# Patient Record
Sex: Female | Born: 2000 | Hispanic: Yes | Marital: Single | Smoking: Never smoker
Health system: Southern US, Community
[De-identification: ages and names within clinical notes are randomized; demographics above are authoritative.]

## PROBLEM LIST (undated history)

## (undated) DIAGNOSIS — Z789 Other specified health status: Secondary | ICD-10-CM

## (undated) HISTORY — PX: KNEE SURGERY: SHX244

## (undated) HISTORY — PX: TONSILLECTOMY: SUR1361

---
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
## 2021-03-03 DIAGNOSIS — N760 Acute vaginitis: Secondary | ICD-10-CM | POA: Diagnosis not present

## 2021-03-03 DIAGNOSIS — Z113 Encounter for screening for infections with a predominantly sexual mode of transmission: Secondary | ICD-10-CM | POA: Diagnosis not present

## 2021-05-06 DIAGNOSIS — Z113 Encounter for screening for infections with a predominantly sexual mode of transmission: Secondary | ICD-10-CM | POA: Diagnosis not present

## 2021-05-06 DIAGNOSIS — B3731 Acute candidiasis of vulva and vagina: Secondary | ICD-10-CM | POA: Diagnosis not present

## 2021-05-06 DIAGNOSIS — R350 Frequency of micturition: Secondary | ICD-10-CM | POA: Diagnosis not present

## 2021-06-03 ENCOUNTER — Other Ambulatory Visit: Payer: Self-pay

## 2021-06-03 DIAGNOSIS — D72829 Elevated white blood cell count, unspecified: Secondary | ICD-10-CM | POA: Diagnosis not present

## 2021-06-03 DIAGNOSIS — N390 Urinary tract infection, site not specified: Secondary | ICD-10-CM | POA: Insufficient documentation

## 2021-06-03 DIAGNOSIS — R945 Abnormal results of liver function studies: Secondary | ICD-10-CM | POA: Diagnosis not present

## 2021-06-03 DIAGNOSIS — Z20822 Contact with and (suspected) exposure to covid-19: Secondary | ICD-10-CM | POA: Diagnosis not present

## 2021-06-03 DIAGNOSIS — R7401 Elevation of levels of liver transaminase levels: Secondary | ICD-10-CM | POA: Insufficient documentation

## 2021-06-03 DIAGNOSIS — R1111 Vomiting without nausea: Secondary | ICD-10-CM | POA: Diagnosis not present

## 2021-06-03 DIAGNOSIS — R6883 Chills (without fever): Secondary | ICD-10-CM | POA: Diagnosis not present

## 2021-06-03 DIAGNOSIS — R109 Unspecified abdominal pain: Secondary | ICD-10-CM | POA: Diagnosis not present

## 2021-06-04 ENCOUNTER — Emergency Department (HOSPITAL_BASED_OUTPATIENT_CLINIC_OR_DEPARTMENT_OTHER): Payer: Federal, State, Local not specified - PPO

## 2021-06-04 ENCOUNTER — Emergency Department (HOSPITAL_BASED_OUTPATIENT_CLINIC_OR_DEPARTMENT_OTHER)
Admission: EM | Admit: 2021-06-04 | Discharge: 2021-06-04 | Disposition: A | Discharge status: Home / Self Care | Payer: Federal, State, Local not specified - PPO | Attending: Emergency Medicine | Admitting: Emergency Medicine

## 2021-06-04 ENCOUNTER — Encounter (HOSPITAL_BASED_OUTPATIENT_CLINIC_OR_DEPARTMENT_OTHER): Payer: Self-pay | Admitting: Emergency Medicine

## 2021-06-04 DIAGNOSIS — R6883 Chills (without fever): Secondary | ICD-10-CM | POA: Diagnosis not present

## 2021-06-04 DIAGNOSIS — N390 Urinary tract infection, site not specified: Secondary | ICD-10-CM

## 2021-06-04 DIAGNOSIS — R1111 Vomiting without nausea: Secondary | ICD-10-CM | POA: Diagnosis not present

## 2021-06-04 DIAGNOSIS — R109 Unspecified abdominal pain: Secondary | ICD-10-CM | POA: Diagnosis not present

## 2021-06-04 DIAGNOSIS — R7989 Other specified abnormal findings of blood chemistry: Secondary | ICD-10-CM

## 2021-06-04 LAB — URINALYSIS, MICROSCOPIC (REFLEX)

## 2021-06-04 LAB — COMPREHENSIVE METABOLIC PANEL
ALT: 95 U/L — ABNORMAL HIGH (ref 0–44)
AST: 65 U/L — ABNORMAL HIGH (ref 15–41)
Albumin: 3.6 g/dL (ref 3.5–5.0)
Alkaline Phosphatase: 100 U/L (ref 38–126)
Anion gap: 10 (ref 5–15)
BUN: 11 mg/dL (ref 6–20)
CO2: 22 mmol/L (ref 22–32)
Calcium: 8.7 mg/dL — ABNORMAL LOW (ref 8.9–10.3)
Chloride: 103 mmol/L (ref 98–111)
Creatinine, Ser: 0.66 mg/dL (ref 0.44–1.00)
GFR, Estimated: >60 mL/min (ref >60)
Glucose, Bld: 115 mg/dL — ABNORMAL HIGH (ref 70–99)
Potassium: 3.4 mmol/L — ABNORMAL LOW (ref 3.5–5.1)
Sodium: 135 mmol/L (ref 135–145)
Total Bilirubin: 0.8 mg/dL (ref 0.3–1.2)
Total Protein: 6.8 g/dL (ref 6.5–8.1)

## 2021-06-04 LAB — CBC WITH DIFFERENTIAL/PLATELET
Abs Immature Granulocytes: 0.24 10*3/uL — ABNORMAL HIGH (ref 0.00–0.07)
Basophils Absolute: 0.1 10*3/uL (ref 0.0–0.1)
Basophils Relative: 0 %
Eosinophils Absolute: 0.1 10*3/uL (ref 0.0–0.5)
Eosinophils Relative: 1 %
HCT: 37.8 % (ref 36.0–46.0)
Hemoglobin: 12.4 g/dL (ref 12.0–15.0)
Immature Granulocytes: 1 %
Lymphocytes Relative: 6 %
Lymphs Abs: 1.3 10*3/uL (ref 0.7–4.0)
MCH: 30 pg (ref 26.0–34.0)
MCHC: 32.8 g/dL (ref 30.0–36.0)
MCV: 91.3 fL (ref 80.0–100.0)
Monocytes Absolute: 1.3 10*3/uL — ABNORMAL HIGH (ref 0.1–1.0)
Monocytes Relative: 6 %
Neutro Abs: 17.4 10*3/uL — ABNORMAL HIGH (ref 1.7–7.7)
Neutrophils Relative %: 86 %
Platelets: 170 10*3/uL (ref 150–400)
RBC: 4.14 MIL/uL (ref 3.87–5.11)
RDW: 13.2 % (ref 11.5–15.5)
WBC: 20.4 10*3/uL — ABNORMAL HIGH (ref 4.0–10.5)
nRBC: 0 % (ref 0.0–0.2)

## 2021-06-04 LAB — URINALYSIS, ROUTINE W REFLEX MICROSCOPIC
Bilirubin Urine: NEGATIVE
Glucose, UA: 100 mg/dL — AB
Ketones, ur: NEGATIVE mg/dL
Nitrite: NEGATIVE
Protein, ur: 100 mg/dL — AB
Specific Gravity, Urine: 1.02 (ref 1.005–1.030)
pH: 8.5 — ABNORMAL HIGH (ref 5.0–8.0)

## 2021-06-04 LAB — RESP PANEL BY RT-PCR (FLU A&B, COVID) ARPGX2
Influenza A by PCR: NEGATIVE
Influenza B by PCR: NEGATIVE
SARS Coronavirus 2 by RT PCR: NEGATIVE

## 2021-06-04 LAB — PREGNANCY, URINE: Preg Test, Ur: NEGATIVE

## 2021-06-04 MED ORDER — IBUPROFEN 800 MG PO TABS: 800.0000 mg | ORAL_TABLET | Freq: Three times a day (TID) | ORAL | 0 refills | Status: DC

## 2021-06-04 MED ORDER — SODIUM CHLORIDE 0.9 % IV BOLUS
500.0000 mL | Freq: Once | INTRAVENOUS | Status: AC
  Administered 2021-06-04: 500 mL via INTRAVENOUS

## 2021-06-04 MED ORDER — SODIUM CHLORIDE 0.9 % IV SOLN
1.0000 g | Freq: Once | INTRAVENOUS | Status: AC
  Administered 2021-06-04: 1 g via INTRAVENOUS
  Filled 2021-06-04: qty 10

## 2021-06-04 MED ORDER — ONDANSETRON HCL 4 MG PO TABS: 4.0000 mg | ORAL_TABLET | Freq: Three times a day (TID) | ORAL | 0 refills | Status: DC | PRN

## 2021-06-04 MED ORDER — CEPHALEXIN 500 MG PO CAPS: 500.0000 mg | ORAL_CAPSULE | Freq: Four times a day (QID) | ORAL | 0 refills | Status: DC

## 2021-06-04 NOTE — ED Triage Notes (Signed)
N/v, chills, and right sided abdominal pain since yesterday.  ?

## 2021-06-04 NOTE — ED Notes (Signed)
Patient transported to CT 

## 2021-06-04 NOTE — ED Provider Notes (Signed)
?[facility] EMERGENCY DEPARTMENT ?Provider Note ? ? ?CSN: [id number] ?Arrival date & time: 06/03/21  2358 ? ?  ? ?History ? ?Chief Complaint  ?Patient presents with  ? Nausea  ? Abdominal Pain  ? ? ?Donna Bennett is a 21 y.o. female. ? ?The history is provided by the patient.  ?Abdominal Pain ?Pain location:  R flank ?Pain quality: aching   ?Pain radiates to:  Does not radiate ?Pain severity:  Severe ?Onset quality:  Gradual ?Duration:  4 days ?Timing:  Constant ?Progression:  Unchanged ?Chronicity:  New ?Context: not laxative use, not recent travel and not trauma   ?Relieved by:  Nothing ?Worsened by:  Nothing ?Ineffective treatments:  None tried ?Associated symptoms: chills, nausea and vomiting   ?Associated symptoms: no chest pain, no cough, no dysuria and no fever   ?Risk factors: no NSAID use and not pregnant   ? ?  ? ?Home Medications ?Prior to Admission medications   ?Medication Sig Start Date End Date Taking? Authorizing Provider  ?cephALEXin (KEFLEX) 500 MG capsule Take 1 capsule (500 mg total) by mouth 4 (four) times daily. 06/04/21  Yes Zakir Henner, MD  ?ondansetron (ZOFRAN) 4 MG tablet Take 1 tablet (4 mg total) by mouth every 8 (eight) hours as needed for nausea or vomiting. 06/04/21  Yes Paitynn Mikus, MD  ?clotrimazole (LOTRIMIN) 1 % cream Apply to affected area 2 times daily for 2 weeks or until symptoms resolve 08/14/15   Street, [place], [place]  ?   ? ?Allergies    ?Patient has no known allergies.   ? ?Review of Systems   ?Review of Systems  ?Constitutional:  Positive for chills. Negative for fever.  ?HENT:  Negative for facial swelling.   ?Eyes:  Negative for redness.  ?Respiratory:  Negative for cough.   ?Cardiovascular:  Negative for chest pain.  ?Gastrointestinal:  Positive for abdominal pain, nausea and vomiting.  ?Genitourinary:  Positive for flank pain. Negative for difficulty urinating and dysuria.  ?Neurological:  Negative for facial asymmetry.  ?Psychiatric/Behavioral:  Negative  for agitation.   ?All other systems reviewed and are negative. ? ?Physical Exam ?Updated Vital Signs ?BP (!) 105/55   Pulse (!) 111   Temp 98.8 ?F (37.1 ?C) (Oral)   Resp 18   Ht 5\' 3"  (1.6 m)   Wt 64.9 kg   LMP 05/17/2021 (Exact Date)   SpO2 100%   BMI 25.35 kg/m?  ?Physical Exam ?Vitals and nursing note reviewed.  ?Constitutional:   ?   General: She is not in acute distress. ?   Appearance: Normal appearance.  ?HENT:  ?   Head: Normocephalic and atraumatic.  ?   Nose: Nose normal.  ?Eyes:  ?   Conjunctiva/sclera: Conjunctivae normal.  ?   Pupils: Pupils are equal, round, and reactive to light.  ?Cardiovascular:  ?   Rate and Rhythm: Normal rate and regular rhythm.  ?   Pulses: Normal pulses.  ?   Heart sounds: Normal heart sounds.  ?Pulmonary:  ?   Effort: Pulmonary effort is normal.  ?   Breath sounds: Normal breath sounds.  ?Abdominal:  ?   General: Abdomen is flat. Bowel sounds are normal.  ?   Palpations: Abdomen is soft. There is no mass.  ?   Tenderness: There is no abdominal tenderness. There is no guarding or rebound. Negative signs include Murphy's sign, Rovsing's sign and McBurney's sign.  ?   Hernia: No hernia is present.  ?Musculoskeletal:     ?  General: Normal range of motion.  ?   Cervical back: Normal range of motion and neck supple.  ?Skin: ?   General: Skin is warm and dry.  ?   Capillary Refill: Capillary refill takes less than 2 seconds.  ?Neurological:  ?   Mental Status: She is alert.  ?   Deep Tendon Reflexes: Reflexes normal.  ?Psychiatric:     ?   Mood and Affect: Mood normal.     ?   Behavior: Behavior normal.  ? ? ?ED Results / Procedures / Treatments   ?Labs ?(all labs ordered are listed, but only abnormal results are displayed) ?Results for orders placed or performed during the hospital encounter of 06/04/21  ?Pregnancy, urine  ?Result Value Ref Range  ? Preg Test, Ur NEGATIVE NEGATIVE  ?Urinalysis, Routine w reflex microscopic Urine, Clean Catch  ?Result Value Ref Range  ?  Color, Urine YELLOW YELLOW  ? APPearance CLOUDY (A) CLEAR  ? Specific Gravity, Urine 1.020 1.005 - 1.030  ? pH 8.5 (H) 5.0 - 8.0  ? Glucose, UA 100 (A) NEGATIVE mg/dL  ? Hgb urine dipstick TRACE (A) NEGATIVE  ? Bilirubin Urine NEGATIVE NEGATIVE  ? Ketones, ur NEGATIVE NEGATIVE mg/dL  ? Protein, ur 100 (A) NEGATIVE mg/dL  ? Nitrite NEGATIVE NEGATIVE  ? Leukocytes,Ua SMALL (A) NEGATIVE  ?Comprehensive metabolic panel  ?Result Value Ref Range  ? Sodium 135 135 - 145 mmol/L  ? Potassium 3.4 (L) 3.5 - 5.1 mmol/L  ? Chloride 103 98 - 111 mmol/L  ? CO2 22 22 - 32 mmol/L  ? Glucose, Bld 115 (H) 70 - 99 mg/dL  ? BUN 11 6 - 20 mg/dL  ? Creatinine, Ser 0.66 0.44 - 1.00 mg/dL  ? Calcium 8.7 (L) 8.9 - 10.3 mg/dL  ? Total Protein 6.8 6.5 - 8.1 g/dL  ? Albumin 3.6 3.5 - 5.0 g/dL  ? AST 65 (H) 15 - 41 U/L  ? ALT 95 (H) 0 - 44 U/L  ? Alkaline Phosphatase 100 38 - 126 U/L  ? Total Bilirubin 0.8 0.3 - 1.2 mg/dL  ? GFR, Estimated >60 >60 mL/min  ? Anion gap 10 5 - 15  ?CBC with Differential  ?Result Value Ref Range  ? WBC 20.4 (H) 4.0 - 10.5 K/uL  ? RBC 4.14 3.87 - 5.11 MIL/uL  ? Hemoglobin 12.4 12.0 - 15.0 g/dL  ? HCT 37.8 36.0 - 46.0 %  ? MCV 91.3 80.0 - 100.0 fL  ? MCH 30.0 26.0 - 34.0 pg  ? MCHC 32.8 30.0 - 36.0 g/dL  ? RDW 13.2 11.5 - 15.5 %  ? Platelets 170 150 - 400 K/uL  ? nRBC 0.0 0.0 - 0.2 %  ? Neutrophils Relative % 86 %  ? Neutro Abs 17.4 (H) 1.7 - 7.7 K/uL  ? Lymphocytes Relative 6 %  ? Lymphs Abs 1.3 0.7 - 4.0 K/uL  ? Monocytes Relative 6 %  ? Monocytes Absolute 1.3 (H) 0.1 - 1.0 K/uL  ? Eosinophils Relative 1 %  ? Eosinophils Absolute 0.1 0.0 - 0.5 K/uL  ? Basophils Relative 0 %  ? Basophils Absolute 0.1 0.0 - 0.1 K/uL  ? Immature Granulocytes 1 %  ? Abs Immature Granulocytes 0.24 (H) 0.00 - 0.07 K/uL  ?Urinalysis, Microscopic (reflex)  ?Result Value Ref Range  ? RBC / HPF 0-5 0 - 5 RBC/hpf  ? WBC, UA 21-50 0 - 5 WBC/hpf  ? Bacteria, UA MANY (A) NONE SEEN  ? Squamous Epithelial /  LPF 11-20 0 - 5  ? WBC Clumps PRESENT    ? ?CT Renal Stone Study ? ?Result Date: 06/04/2021 ?CLINICAL DATA:  Nausea, vomiting, chills, right side abdominal pain. EXAM: CT ABDOMEN AND PELVIS WITHOUT CONTRAST TECHNIQUE: Multidetector CT imaging of the abdomen and pelvis was performed following the standard protocol without IV contrast. RADIATION DOSE REDUCTION: This exam was performed according to the departmental dose-optimization program which includes automated exposure control, adjustment of the mA and/or kV according to patient size and/or use of iterative reconstruction technique. COMPARISON:  None. FINDINGS: Lower chest: No acute abnormality Hepatobiliary: No focal hepatic abnormality. Gallbladder unremarkable. Pancreas: No focal abnormality or ductal dilatation. Spleen: No focal abnormality.  Normal size. Adrenals/Urinary Tract: No renal or ureteral stones. No hydronephrosis. No renal or adrenal mass. Urinary bladder unremarkable. Stomach/Bowel: Normal appendix. Stomach, large and small bowel grossly unremarkable. Vascular/Lymphatic: No evidence of aneurysm or adenopathy. Reproductive: Uterus and adnexa unremarkable.  No mass. Other: No free fluid or free air. Musculoskeletal: No acute bony abnormality. IMPRESSION: No renal or ureteral stones.  No hydronephrosis. Electronically Signed   By: Charlett Nose M.D.   On: 06/04/2021 02:04   ? ? ?None ? ?Radiology ?CT Renal Stone Study ? ?Result Date: 06/04/2021 ?CLINICAL DATA:  Nausea, vomiting, chills, right side abdominal pain. EXAM: CT ABDOMEN AND PELVIS WITHOUT CONTRAST TECHNIQUE: Multidetector CT imaging of the abdomen and pelvis was performed following the standard protocol without IV contrast. RADIATION DOSE REDUCTION: This exam was performed according to the departmental dose-optimization program which includes automated exposure control, adjustment of the mA and/or kV according to patient size and/or use of iterative reconstruction technique. COMPARISON:  None. FINDINGS: Lower chest: No acute  abnormality Hepatobiliary: No focal hepatic abnormality. Gallbladder unremarkable. Pancreas: No focal abnormality or ductal dilatation. Spleen: No focal abnormality.  Normal size. Adrenals/Urinary Tract: No renal or ur

## 2021-06-04 NOTE — ED Notes (Signed)
Patient discharged to home.  All discharge instructions reviewed.  Patient verbalized understanding via teachback method.  VS WDL.  Respirations even and unlabored.  Ambulatory out of ED.   °

## 2021-06-11 DIAGNOSIS — Z113 Encounter for screening for infections with a predominantly sexual mode of transmission: Secondary | ICD-10-CM | POA: Diagnosis not present

## 2021-06-11 DIAGNOSIS — Z6825 Body mass index (BMI) 25.0-25.9, adult: Secondary | ICD-10-CM | POA: Diagnosis not present

## 2021-06-11 DIAGNOSIS — Z01419 Encounter for gynecological examination (general) (routine) without abnormal findings: Secondary | ICD-10-CM | POA: Diagnosis not present

## 2021-06-11 DIAGNOSIS — R8279 Other abnormal findings on microbiological examination of urine: Secondary | ICD-10-CM | POA: Diagnosis not present

## 2021-06-12 DIAGNOSIS — L400 Psoriasis vulgaris: Secondary | ICD-10-CM | POA: Diagnosis not present

## 2021-09-17 DIAGNOSIS — Z113 Encounter for screening for infections with a predominantly sexual mode of transmission: Secondary | ICD-10-CM | POA: Diagnosis not present

## 2021-09-17 DIAGNOSIS — N760 Acute vaginitis: Secondary | ICD-10-CM | POA: Diagnosis not present

## 2022-02-18 DIAGNOSIS — N911 Secondary amenorrhea: Secondary | ICD-10-CM | POA: Diagnosis not present

## 2022-03-03 DIAGNOSIS — Z3400 Encounter for supervision of normal first pregnancy, unspecified trimester: Secondary | ICD-10-CM | POA: Diagnosis not present

## 2022-03-03 DIAGNOSIS — Z3685 Encounter for antenatal screening for Streptococcus B: Secondary | ICD-10-CM | POA: Diagnosis not present

## 2022-03-03 DIAGNOSIS — Z3A08 8 weeks gestation of pregnancy: Secondary | ICD-10-CM | POA: Diagnosis not present

## 2022-03-03 DIAGNOSIS — Z3481 Encounter for supervision of other normal pregnancy, first trimester: Secondary | ICD-10-CM | POA: Diagnosis not present

## 2022-03-03 LAB — OB RESULTS CONSOLE HEPATITIS B SURFACE ANTIGEN: Hepatitis B Surface Ag: NEGATIVE

## 2022-03-03 LAB — OB RESULTS CONSOLE ABO/RH: RH Type: POSITIVE

## 2022-03-03 LAB — OB RESULTS CONSOLE ANTIBODY SCREEN: Antibody Screen: NEGATIVE

## 2022-03-03 LAB — HEPATITIS C ANTIBODY: HCV Ab: NEGATIVE

## 2022-03-19 DIAGNOSIS — Z113 Encounter for screening for infections with a predominantly sexual mode of transmission: Secondary | ICD-10-CM | POA: Diagnosis not present

## 2022-03-19 DIAGNOSIS — Z3400 Encounter for supervision of normal first pregnancy, unspecified trimester: Secondary | ICD-10-CM | POA: Diagnosis not present

## 2022-03-19 DIAGNOSIS — Z3A10 10 weeks gestation of pregnancy: Secondary | ICD-10-CM | POA: Diagnosis not present

## 2022-03-19 LAB — OB RESULTS CONSOLE RUBELLA ANTIBODY, IGM: Rubella: IMMUNE

## 2022-03-19 LAB — OB RESULTS CONSOLE GC/CHLAMYDIA
Chlamydia: NEGATIVE
Neisseria Gonorrhea: NEGATIVE

## 2022-04-11 ENCOUNTER — Other Ambulatory Visit: Payer: Self-pay

## 2022-04-11 ENCOUNTER — Encounter (HOSPITAL_BASED_OUTPATIENT_CLINIC_OR_DEPARTMENT_OTHER): Payer: Self-pay

## 2022-04-11 ENCOUNTER — Emergency Department (HOSPITAL_BASED_OUTPATIENT_CLINIC_OR_DEPARTMENT_OTHER)
Admission: EM | Admit: 2022-04-11 | Discharge: 2022-04-11 | Disposition: A | Discharge status: Home / Self Care | Payer: No Typology Code available for payment source | Attending: Emergency Medicine | Admitting: Emergency Medicine

## 2022-04-11 DIAGNOSIS — S301XXA Contusion of abdominal wall, initial encounter: Secondary | ICD-10-CM | POA: Insufficient documentation

## 2022-04-11 DIAGNOSIS — W01198A Fall on same level from slipping, tripping and stumbling with subsequent striking against other object, initial encounter: Secondary | ICD-10-CM | POA: Diagnosis not present

## 2022-04-11 DIAGNOSIS — O9A213 Injury, poisoning and certain other consequences of external causes complicating pregnancy, third trimester: Secondary | ICD-10-CM | POA: Diagnosis present

## 2022-04-11 DIAGNOSIS — W19XXXA Unspecified fall, initial encounter: Secondary | ICD-10-CM

## 2022-04-11 NOTE — ED Provider Notes (Signed)
[facility] Provider Note: Georgena Spurling, MD, FACEP  CSN: [id number] MRN: [id number] ARRIVAL: 04/11/22 at [place]: [place]  Fall   HISTORY OF PRESENT ILLNESS  04/11/22 3:18 AM Donna Bennett is a 22 y.o. female who is about [redacted] weeks pregnant.  She slipped and fell approximately 2 hours prior to arrival and struck the left upper quadrant of her abdomen on the corner of an object.  There is a small localized contusion at that site, and is having pain at that site which she rates as a 3 out of 10.  She is having no vaginal bleeding.Marland Kitchen    History reviewed. No pertinent past medical history.  Past Surgical History:  Procedure Laterality Date   KNEE SURGERY     TONSILLECTOMY      History reviewed. No pertinent family history.  Social History   Tobacco Use   Smoking status: Never   Smokeless tobacco: Never  Substance Use Topics   Alcohol use: No   Drug use: Never    Prior to Admission medications   Not on File    Allergies Patient has no known allergies.   REVIEW OF SYSTEMS  Negative except as noted here or in the History of Present Illness.   PHYSICAL EXAMINATION  Initial Vital Signs Blood pressure 132/80, pulse (!) 107, temperature 98 F (36.7 C), temperature source Oral, resp. rate 16, height '5\' 3"'$  (1.6 m), weight 63.5 kg, last menstrual period 12/24/2021, SpO2 100 %.  Examination General: Well-developed, well-nourished female in no acute distress; appearance consistent with age of record HENT: normocephalic; atraumatic Eyes: Normal appearance Neck: supple Heart: regular rate and rhythm Lungs: clear to auscultation bilaterally Abdomen: soft; nondistended; small contusion of the left upper quadrant with localized tenderness, no suprapubic tenderness; bowel sounds present GU: Cervix closed, thick and high; no vaginal bleeding; single IUP seen on bedside ultrasound, FHR 144 Extremities: No deformity; full range of motion;  pulses normal Neurologic: Awake, alert and oriented; motor function intact in all extremities and symmetric; no facial droop Skin: Warm and dry Psychiatric: Normal mood and affect   RESULTS  Summary of this visit's results, reviewed and interpreted by myself:   EKG Interpretation  Date/Time:    Ventricular Rate:    PR Interval:    QRS Duration:   QT Interval:    QTC Calculation:   R Axis:     Text Interpretation:         Laboratory Studies: No results found for this or any previous visit (from the past 24 hour(s)). Imaging Studies: No results found.  ED COURSE and MDM  Nursing notes, initial and subsequent vitals signs, including pulse oximetry, reviewed and interpreted by myself.  Vitals:   04/11/22 0315  BP: 132/80  Pulse: (!) 107  Resp: 16  Temp: 98 F (36.7 C)  TempSrc: Oral  SpO2: 100%  Weight: 63.5 kg  Height: '5\' 3"'$  (1.6 m)   Medications - No data to display  The location of the patient's injury is located significantly higher than her uterine fundus.  I think the chances of any uterine damage is minimal.  Fetal heart tones and fetal movement seen on bedside ultrasound are reassuring.  The tenderness and contusion of her abdomen is very localized.  She was advised that should she have any worsening symptoms she should go to MAU at [facility].  PROCEDURES  Procedures   ED DIAGNOSES     ICD-10-CM   1. Fall,  initial encounter  W19.XXXA     2. Contusion of anterior abdominal wall  S30.1XXA          Stanislav Gervase, Jenny Reichmann, MD 04/11/22 [phone]

## 2022-04-11 NOTE — ED Triage Notes (Signed)
Pt states that she slipped and fell approx 2 hours ago at work and landed on left side of abdomen. Pt is [redacted] weeks pregnant.

## 2022-04-11 NOTE — ED Notes (Signed)
Dr Florina Ou at bedside with [place] machine

## 2022-05-28 DIAGNOSIS — Z363 Encounter for antenatal screening for malformations: Secondary | ICD-10-CM | POA: Diagnosis not present

## 2022-05-28 DIAGNOSIS — Z361 Encounter for antenatal screening for raised alphafetoprotein level: Secondary | ICD-10-CM | POA: Diagnosis not present

## 2022-05-28 DIAGNOSIS — Z3A20 20 weeks gestation of pregnancy: Secondary | ICD-10-CM | POA: Diagnosis not present

## 2022-07-07 DIAGNOSIS — Z3A26 26 weeks gestation of pregnancy: Secondary | ICD-10-CM | POA: Diagnosis not present

## 2022-07-07 DIAGNOSIS — O368120 Decreased fetal movements, second trimester, not applicable or unspecified: Secondary | ICD-10-CM | POA: Diagnosis not present

## 2022-07-22 DIAGNOSIS — Z3480 Encounter for supervision of other normal pregnancy, unspecified trimester: Secondary | ICD-10-CM | POA: Diagnosis not present

## 2022-07-22 DIAGNOSIS — Z23 Encounter for immunization: Secondary | ICD-10-CM | POA: Diagnosis not present

## 2022-07-22 LAB — OB RESULTS CONSOLE RPR: RPR: NONREACTIVE

## 2022-07-22 LAB — OB RESULTS CONSOLE HIV ANTIBODY (ROUTINE TESTING): HIV: NONREACTIVE

## 2022-08-07 DIAGNOSIS — M5489 Other dorsalgia: Secondary | ICD-10-CM | POA: Diagnosis not present

## 2022-09-03 ENCOUNTER — Inpatient Hospital Stay (HOSPITAL_COMMUNITY)
Admission: AD | Admit: 2022-09-03 | Discharge: 2022-09-03 | Disposition: A | Discharge status: Home / Self Care | Payer: Federal, State, Local not specified - PPO | Attending: Obstetrics and Gynecology | Admitting: Obstetrics and Gynecology

## 2022-09-03 ENCOUNTER — Encounter (HOSPITAL_COMMUNITY): Payer: Self-pay | Admitting: *Deleted

## 2022-09-03 DIAGNOSIS — B3731 Acute candidiasis of vulva and vagina: Secondary | ICD-10-CM | POA: Insufficient documentation

## 2022-09-03 DIAGNOSIS — O23593 Infection of other part of genital tract in pregnancy, third trimester: Secondary | ICD-10-CM | POA: Insufficient documentation

## 2022-09-03 DIAGNOSIS — Z3A34 34 weeks gestation of pregnancy: Secondary | ICD-10-CM

## 2022-09-03 DIAGNOSIS — B9689 Other specified bacterial agents as the cause of diseases classified elsewhere: Secondary | ICD-10-CM | POA: Insufficient documentation

## 2022-09-03 DIAGNOSIS — O98813 Other maternal infectious and parasitic diseases complicating pregnancy, third trimester: Secondary | ICD-10-CM | POA: Diagnosis not present

## 2022-09-03 DIAGNOSIS — Z0371 Encounter for suspected problem with amniotic cavity and membrane ruled out: Secondary | ICD-10-CM | POA: Diagnosis not present

## 2022-09-03 DIAGNOSIS — O26893 Other specified pregnancy related conditions, third trimester: Secondary | ICD-10-CM | POA: Diagnosis not present

## 2022-09-03 HISTORY — DX: Other specified health status: Z78.9

## 2022-09-03 LAB — WET PREP, GENITAL
Sperm: NONE SEEN
Trich, Wet Prep: NONE SEEN
WBC, Wet Prep HPF POC: >=10 — AB (ref <10)

## 2022-09-03 LAB — URINALYSIS, ROUTINE W REFLEX MICROSCOPIC
Bilirubin Urine: NEGATIVE
Glucose, UA: 50 mg/dL — AB
Hgb urine dipstick: NEGATIVE
Ketones, ur: NEGATIVE mg/dL
Nitrite: NEGATIVE
Protein, ur: NEGATIVE mg/dL
Specific Gravity, Urine: 1.009 (ref 1.005–1.030)
pH: 7 (ref 5.0–8.0)

## 2022-09-03 LAB — RUPTURE OF MEMBRANE (ROM)PLUS: Rom Plus: NEGATIVE

## 2022-09-03 MED ORDER — TERCONAZOLE 0.8 % VA CREA: 1.0000 | TOPICAL_CREAM | Freq: Every day | VAGINAL | 0 refills | Status: DC

## 2022-09-03 MED ORDER — METRONIDAZOLE 500 MG PO TABS: 500.0000 mg | ORAL_TABLET | Freq: Two times a day (BID) | ORAL | 0 refills | Status: AC

## 2022-09-03 NOTE — MAU Note (Signed)
.  Donna Bennett is a 22 y.o. at [redacted]w[redacted]d here in MAU reporting since 1300 has had several occ of leaking white watery liquid. Denies VB. Some mild cramping. Reports good FM Onset of complaint: 1300 Pain score: 8 Vitals:   09/03/22 1946 09/03/22 1949  BP:  126/76  Pulse: 94   Resp: 18   Temp: 97.9 F (36.6 C)   SpO2: 100%      FHT:160 Lab orders placed from triage:  u/a

## 2022-09-03 NOTE — MAU Provider Note (Signed)
Obstetric Attending MAU Note  Chief Complaint:  Vaginal Discharge and Abdominal Pain   Event Date/Time   First Provider Initiated Contact with Patient 09/03/22 2020     HPI: Donna Bennett is a 22 y.o. G2P0010 at [redacted]w[redacted]d who presents to maternity admissions reporting leaking of clear/white discharge today. It kept running out. Also with mild cramping. Denies vaginal bleeding. Good fetal movement.   Pregnancy Course: Receives care at [facility]  Prenatal records reviewed - no issues.  Past Medical History:  Diagnosis Date   Medical history non-contributory     OB History  Gravida Para Term Preterm AB Living  2       1    SAB IAB Ectopic Multiple Live Births               # Outcome Date GA Lbr Len/2nd Weight Sex Type Anes PTL Lv  2 Current           1 AB             Past Surgical History:  Procedure Laterality Date   KNEE SURGERY     TONSILLECTOMY      Family History: History reviewed. No pertinent family history.  Social History: Social History   Tobacco Use   Smoking status: Never   Smokeless tobacco: Never  Substance Use Topics   Alcohol use: No   Drug use: Never    Allergies: No Known Allergies  Medications Prior to Admission  Medication Sig Dispense Refill Last Dose   Prenatal Vit-Fe Fumarate-FA (MULTIVITAMIN-PRENATAL) 27-0.8 MG TABS tablet Take 1 tablet by mouth daily at 12 noon.   09/03/2022    ROS: Pertinent findings in history of present illness.  Physical Exam  Blood pressure 126/76, pulse 94, temperature 97.9 F (36.6 C), resp. rate 18, height 5\' 3"  (1.6 m), weight 75.3 kg, last menstrual period 12/24/2021, SpO2 100%. CONSTITUTIONAL: Well-developed, well-nourished female in no acute distress.  HENT:  Normocephalic, atraumatic, External right and left ear normal. Oropharynx is clear and moist EYES: Conjunctivae and EOM are normal. No scleral icterus.  NECK: Normal range of motion, supple, no masses SKIN: Skin is warm and dry. No rash noted. Not  diaphoretic. No erythema. No pallor. NEUROLGIC: Alert and oriented to person, place, and time. CARDIOVASCULAR: Normal heart rate noted, regular rhythm RESPIRATORY: Effort and breath sounds normal, no problems with respiration noted ABDOMEN: Soft, nontender, nondistended, gravid appropriate for gestational age MUSCULOSKELETAL: Normal range of motion. No edema and no tenderness. 2+ distal pulses.  SPECULUM EXAM: NEFG, adherent yellow discharge, no blood, cervix clean Dilation: 1 Exam by:: Shawnie Pons, MD    FHT:  Baseline 140 , moderate variability, accelerations present, no decelerations Contractions: q 6-10 mins   Labs: Results for orders placed or performed during the hospital encounter of 09/03/22 (from the past 24 hour(s))  Urinalysis, Routine w reflex microscopic -Urine, Clean Catch     Status: Abnormal   Collection Time: 09/03/22  8:28 PM  Result Value Ref Range   Color, Urine YELLOW YELLOW   APPearance CLEAR CLEAR   Specific Gravity, Urine 1.009 1.005 - 1.030   pH 7.0 5.0 - 8.0   Glucose, UA 50 (A) NEGATIVE mg/dL   Hgb urine dipstick NEGATIVE NEGATIVE   Bilirubin Urine NEGATIVE NEGATIVE   Ketones, ur NEGATIVE NEGATIVE mg/dL   Protein, ur NEGATIVE NEGATIVE mg/dL   Nitrite NEGATIVE NEGATIVE   Leukocytes,Ua MODERATE (A) NEGATIVE   RBC / HPF 0-5 0 - 5 RBC/hpf   WBC, UA 0-5  0 - 5 WBC/hpf   Bacteria, UA RARE (A) NONE SEEN   Squamous Epithelial / HPF 6-10 0 - 5 /HPF  Wet prep, genital     Status: Abnormal   Collection Time: 09/03/22  8:28 PM  Result Value Ref Range   Yeast Wet Prep HPF POC PRESENT (A) NONE SEEN   Trich, Wet Prep NONE SEEN NONE SEEN   Clue Cells Wet Prep HPF POC PRESENT (A) NONE SEEN   WBC, Wet Prep HPF POC >=10 (A) <10   Sperm NONE SEEN   Rupture of Membrane (ROM) Plus     Status: None   Collection Time: 09/03/22  8:28 PM  Result Value Ref Range   Rom Plus NEGATIVE     Imaging:  No results found.  MAU Course: Monitored x 1 hour ROM plus  negative  Assessment: 1. Encounter for suspected premature rupture of amniotic membranes, with rupture of membranes not found   2. [redacted] weeks gestation of pregnancy   3. Bacterial vaginosis in pregnancy   4. Candida vaginitis     Plan: Discharge home Rx for BV and yeast sent over Labor precautions and fetal kick counts reviewed Follow up with OB provider   Follow-up Information     [facility] Follow up in 1 week(s).   Why: keep next scheduled appointment Contact information: [address] [place] [address] [phone]                 Allergies as of 09/03/2022   No Known Allergies      Medication List     TAKE these medications    metroNIDAZOLE 500 MG tablet Commonly known as: FLAGYL Take 1 tablet (500 mg total) by mouth 2 (two) times daily for 7 days.   multivitamin-prenatal 27-0.8 MG Tabs tablet Take 1 tablet by mouth daily at 12 noon.   terconazole 0.8 % vaginal cream Commonly known as: TERAZOL 3 Place 1 applicator vaginally at bedtime.        Reva Bores, MD 09/03/2022 9:19 PM

## 2022-09-08 LAB — OB RESULTS CONSOLE GBS: GBS: POSITIVE

## 2022-09-24 ENCOUNTER — Inpatient Hospital Stay (HOSPITAL_COMMUNITY)
Admission: AD | Admit: 2022-09-24 | Discharge: 2022-09-24 | Disposition: A | Discharge status: Home / Self Care | Payer: Federal, State, Local not specified - PPO | Attending: Obstetrics and Gynecology | Admitting: Obstetrics and Gynecology

## 2022-09-24 ENCOUNTER — Other Ambulatory Visit: Payer: Self-pay

## 2022-09-24 ENCOUNTER — Encounter (HOSPITAL_COMMUNITY): Payer: Self-pay | Admitting: Obstetrics and Gynecology

## 2022-09-24 DIAGNOSIS — O163 Unspecified maternal hypertension, third trimester: Secondary | ICD-10-CM | POA: Diagnosis not present

## 2022-09-24 DIAGNOSIS — O26893 Other specified pregnancy related conditions, third trimester: Secondary | ICD-10-CM | POA: Diagnosis not present

## 2022-09-24 DIAGNOSIS — R030 Elevated blood-pressure reading, without diagnosis of hypertension: Secondary | ICD-10-CM | POA: Diagnosis not present

## 2022-09-24 DIAGNOSIS — R1010 Upper abdominal pain, unspecified: Secondary | ICD-10-CM | POA: Diagnosis not present

## 2022-09-24 DIAGNOSIS — Z3A37 37 weeks gestation of pregnancy: Secondary | ICD-10-CM | POA: Diagnosis not present

## 2022-09-24 DIAGNOSIS — O1203 Gestational edema, third trimester: Secondary | ICD-10-CM | POA: Insufficient documentation

## 2022-09-24 DIAGNOSIS — M7989 Other specified soft tissue disorders: Secondary | ICD-10-CM | POA: Diagnosis not present

## 2022-09-24 LAB — COMPREHENSIVE METABOLIC PANEL
ALT: 12 U/L (ref 0–44)
AST: 17 U/L (ref 15–41)
Albumin: 2.9 g/dL — ABNORMAL LOW (ref 3.5–5.0)
Alkaline Phosphatase: 187 U/L — ABNORMAL HIGH (ref 38–126)
Anion gap: 10 (ref 5–15)
BUN: 5 mg/dL — ABNORMAL LOW (ref 6–20)
CO2: 20 mmol/L — ABNORMAL LOW (ref 22–32)
Calcium: 8.8 mg/dL — ABNORMAL LOW (ref 8.9–10.3)
Chloride: 105 mmol/L (ref 98–111)
Creatinine, Ser: 0.46 mg/dL (ref 0.44–1.00)
GFR, Estimated: >60 mL/min (ref >60)
Glucose, Bld: 99 mg/dL (ref 70–99)
Potassium: 3.7 mmol/L (ref 3.5–5.1)
Sodium: 135 mmol/L (ref 135–145)
Total Bilirubin: 0.3 mg/dL (ref 0.3–1.2)
Total Protein: 6.4 g/dL — ABNORMAL LOW (ref 6.5–8.1)

## 2022-09-24 LAB — CBC
HCT: 35.1 % — ABNORMAL LOW (ref 36.0–46.0)
Hemoglobin: 11.1 g/dL — ABNORMAL LOW (ref 12.0–15.0)
MCH: 26.1 pg (ref 26.0–34.0)
MCHC: 31.6 g/dL (ref 30.0–36.0)
MCV: 82.6 fL (ref 80.0–100.0)
Platelets: 276 10*3/uL (ref 150–400)
RBC: 4.25 MIL/uL (ref 3.87–5.11)
RDW: 13.8 % (ref 11.5–15.5)
WBC: 8.7 10*3/uL (ref 4.0–10.5)
nRBC: 0 % (ref 0.0–0.2)

## 2022-09-24 LAB — PROTEIN / CREATININE RATIO, URINE
Creatinine, Urine: 22 mg/dL
Total Protein, Urine: <6 mg/dL

## 2022-09-24 NOTE — MAU Note (Signed)
.  Donna Bennett is a 22 y.o. at [redacted]w[redacted]d here in MAU reporting: Pt reports she was being seen at the doctor today and was sent over for further evaluation of her blood pressure.  Pt reports feeling dizzy and blurry vision that started a couple of weeks ago.   Onset of complaint: a couple of weeks ago  Pain score: denies  There were no vitals filed for this visit.    Lab orders placed from triage:   ua

## 2022-09-24 NOTE — MAU Provider Note (Signed)
History     CSN: [id number]  Arrival date and time: 09/24/22 1452   None     Chief Complaint  Patient presents with   Hypertension   HPI Donna Bennett is a 22 yo G2P0010 @ [redacted]w[redacted]d presenting for elevated blood pressure.  Was seen in the clinic today and sent for evaluation for preeclampsia.  Patient reports this is the first time her blood pressure has been elevated.  She denies any headaches.  Reports blurry vision occasionally but not constant.  Reports upper abdominal pain bilaterally.  Patient states that she does have increased swelling in her feet that gradually increases throughout the day and decreases at night.  Denies any vaginal bleeding, gush of fluid, contractions.  Good fetal movement.  No other concerns. OB History     Gravida  2   Para      Term      Preterm      AB  1   Living         SAB      IAB      Ectopic      Multiple      Live Births              Past Medical History:  Diagnosis Date   Medical history non-contributory     Past Surgical History:  Procedure Laterality Date   KNEE SURGERY     TONSILLECTOMY      History reviewed. No pertinent family history.  Social History   Tobacco Use   Smoking status: Never   Smokeless tobacco: Never  Substance Use Topics   Alcohol use: No   Drug use: Never    Allergies: No Known Allergies  Medications Prior to Admission  Medication Sig Dispense Refill Last Dose   Prenatal Vit-Fe Fumarate-FA (MULTIVITAMIN-PRENATAL) 27-0.8 MG TABS tablet Take 1 tablet by mouth daily at 12 noon.      terconazole (TERAZOL 3) 0.8 % vaginal cream Place 1 applicator vaginally at bedtime. 20 g 0     Review of Systems  Constitutional:  Negative for fever.  HENT:  Negative for congestion and rhinorrhea.   Eyes:  Positive for visual disturbance (Occasional blurriness).  Respiratory:  Negative for shortness of breath.   Cardiovascular:  Negative for chest pain.  Gastrointestinal:  Positive for abdominal  pain (Bilaterally in the upper aspects of her abdomen). Negative for diarrhea, nausea and rectal pain.  All other systems reviewed and are negative.  Physical Exam   Blood pressure 119/74, pulse (!) 107, temperature 97.7 F (36.5 C), temperature source Oral, resp. rate (!) 22, weight 77.7 kg, last menstrual period 12/24/2021, SpO2 100%.  Physical Exam Vitals and nursing note reviewed.  Constitutional:      Appearance: Normal appearance.  HENT:     Head: Normocephalic and atraumatic.     Nose: Nose normal.     Mouth/Throat:     Mouth: Mucous membranes are moist.  Eyes:     Pupils: Pupils are equal, round, and reactive to light.  Cardiovascular:     Rate and Rhythm: Normal rate.     Pulses: Normal pulses.  Pulmonary:     Effort: Pulmonary effort is normal.  Abdominal:     Tenderness: There is abdominal tenderness (Patient reports mild "discomfort" on the right side). There is no guarding or rebound.     Comments: Gravid  Musculoskeletal:        General: Normal range of motion.  Skin:    General:  Skin is warm.     Capillary Refill: Capillary refill takes less than 2 seconds.  Neurological:     General: No focal deficit present.     Mental Status: She is alert.  Psychiatric:        Mood and Affect: Mood normal.     MAU Course  Procedures  MDM CBC CMP Protein creatinine ratio Physical exam   Assessment and Plan  Donna Bennett is a 22 yo G2P0010 @ [redacted]w[redacted]d presenting for elevated blood pressure.   Elevated blood pressure affecting pregnancy Patient presenting for evaluation for possible preeclampsia.  Since arrival patient has had normal blood pressures.  CMP is appropriate, CBC appropriate.  PC ratio within normal limits.  Patient denies headache.  Reports no blurry vision at this time.  Patient states she has pain in both outer upper quadrants as well as her epigastric area.  No exquisite tenderness to palpation right upper quadrant.  Mild lower extremity edema.   Discussed with on-call provider for patient's practice and patient will follow-up in clinic for blood pressure check early next week.  Discussed preeclampsia signs and symptoms and return precautions and patient was discharged home.  Celedonio Savage 09/24/2022, 3:44 PM

## 2022-09-24 NOTE — Discharge Instructions (Signed)
It was a pleasure taking care of you today.  Your blood pressures have been completely normal since he arrived in the emergency department.  We collected lab work which came back normal.  I talked with the on-call provider for your practice and you will need to be seen early next week for blood pressure check but do not need to be admitted at this time.  If you have any worsening symptoms, concerns please return for further evaluation.  I be of wonderful afternoon!

## 2022-09-30 ENCOUNTER — Inpatient Hospital Stay (HOSPITAL_COMMUNITY)
Admission: AD | Admit: 2022-09-30 | Discharge: 2022-10-02 | DRG: 807 | Disposition: A | Discharge status: Home / Self Care | Payer: Federal, State, Local not specified - PPO | Attending: Obstetrics and Gynecology | Admitting: Obstetrics and Gynecology

## 2022-09-30 ENCOUNTER — Inpatient Hospital Stay (HOSPITAL_COMMUNITY): Payer: Federal, State, Local not specified - PPO | Admitting: Anesthesiology

## 2022-09-30 ENCOUNTER — Encounter (HOSPITAL_COMMUNITY): Payer: Self-pay | Admitting: Obstetrics and Gynecology

## 2022-09-30 ENCOUNTER — Other Ambulatory Visit: Payer: Self-pay

## 2022-09-30 DIAGNOSIS — Z3490 Encounter for supervision of normal pregnancy, unspecified, unspecified trimester: Secondary | ICD-10-CM

## 2022-09-30 DIAGNOSIS — O99824 Streptococcus B carrier state complicating childbirth: Secondary | ICD-10-CM | POA: Diagnosis not present

## 2022-09-30 DIAGNOSIS — Z3A38 38 weeks gestation of pregnancy: Secondary | ICD-10-CM | POA: Diagnosis not present

## 2022-09-30 DIAGNOSIS — O26893 Other specified pregnancy related conditions, third trimester: Secondary | ICD-10-CM | POA: Diagnosis not present

## 2022-09-30 LAB — HIV ANTIBODY (ROUTINE TESTING W REFLEX): HIV Screen 4th Generation wRfx: NONREACTIVE

## 2022-09-30 LAB — CBC
HCT: 34.9 % — ABNORMAL LOW (ref 36.0–46.0)
Hemoglobin: 11 g/dL — ABNORMAL LOW (ref 12.0–15.0)
MCH: 25.8 pg — ABNORMAL LOW (ref 26.0–34.0)
MCHC: 31.5 g/dL (ref 30.0–36.0)
MCV: 81.9 fL (ref 80.0–100.0)
Platelets: 269 10*3/uL (ref 150–400)
RBC: 4.26 MIL/uL (ref 3.87–5.11)
RDW: 13.8 % (ref 11.5–15.5)
WBC: 13.3 10*3/uL — ABNORMAL HIGH (ref 4.0–10.5)
nRBC: 0 % (ref 0.0–0.2)

## 2022-09-30 LAB — TYPE AND SCREEN
ABO/RH(D): A POS
Antibody Screen: NEGATIVE

## 2022-09-30 MED ORDER — ONDANSETRON HCL 4 MG/2ML IJ SOLN
4.0000 mg | Freq: Four times a day (QID) | INTRAMUSCULAR | Status: DC | PRN
  Administered 2022-09-30: 4 mg via INTRAVENOUS
  Filled 2022-09-30: qty 2

## 2022-09-30 MED ORDER — SODIUM CHLORIDE 0.9 % IV SOLN
1.0000 g | INTRAVENOUS | Status: DC
  Administered 2022-09-30: 1 g via INTRAVENOUS
  Filled 2022-09-30: qty 1000

## 2022-09-30 MED ORDER — OXYTOCIN-SODIUM CHLORIDE 30-0.9 UT/500ML-% IV SOLN
2.5000 [IU]/h | INTRAVENOUS | Status: DC
  Filled 2022-09-30: qty 500

## 2022-09-30 MED ORDER — LACTATED RINGERS IV SOLN: INTRAVENOUS | Status: DC

## 2022-09-30 MED ORDER — EPHEDRINE 5 MG/ML INJ: 10.0000 mg | INTRAVENOUS | Status: DC | PRN

## 2022-09-30 MED ORDER — OXYTOCIN BOLUS FROM INFUSION: 333.0000 mL | Freq: Once | INTRAVENOUS | Status: DC

## 2022-09-30 MED ORDER — METHYLERGONOVINE MALEATE 0.2 MG/ML IJ SOLN
INTRAMUSCULAR | Status: AC
  Administered 2022-09-30: 0.2 mg via INTRAMUSCULAR
  Filled 2022-09-30: qty 1

## 2022-09-30 MED ORDER — ACETAMINOPHEN 325 MG PO TABS: 650.0000 mg | ORAL_TABLET | ORAL | Status: DC | PRN

## 2022-09-30 MED ORDER — OXYCODONE-ACETAMINOPHEN 5-325 MG PO TABS: 2.0000 | ORAL_TABLET | ORAL | Status: DC | PRN

## 2022-09-30 MED ORDER — FENTANYL CITRATE (PF) 100 MCG/2ML IJ SOLN
50.0000 ug | INTRAMUSCULAR | Status: DC | PRN
  Administered 2022-09-30: 100 ug via INTRAVENOUS
  Filled 2022-09-30: qty 2

## 2022-09-30 MED ORDER — OXYTOCIN BOLUS FROM INFUSION
333.0000 mL | Freq: Once | INTRAVENOUS | Status: AC
  Administered 2022-10-01: 333 mL via INTRAVENOUS

## 2022-09-30 MED ORDER — SOD CITRATE-CITRIC ACID 500-334 MG/5ML PO SOLN: 30.0000 mL | ORAL | Status: DC | PRN

## 2022-09-30 MED ORDER — OXYTOCIN-SODIUM CHLORIDE 30-0.9 UT/500ML-% IV SOLN: 2.5000 [IU]/h | INTRAVENOUS | Status: DC

## 2022-09-30 MED ORDER — LIDOCAINE-EPINEPHRINE (PF) 2 %-1:200000 IJ SOLN
INTRAMUSCULAR | Status: DC | PRN
  Administered 2022-09-30: 5 mL via EPIDURAL

## 2022-09-30 MED ORDER — FLEET ENEMA RE ENEM: 1.0000 | ENEMA | RECTAL | Status: DC | PRN

## 2022-09-30 MED ORDER — LACTATED RINGERS IV SOLN: 500.0000 mL | INTRAVENOUS | Status: DC | PRN

## 2022-09-30 MED ORDER — PHENYLEPHRINE 80 MCG/ML (10ML) SYRINGE FOR IV PUSH (FOR BLOOD PRESSURE SUPPORT): 80.0000 ug | PREFILLED_SYRINGE | INTRAVENOUS | Status: DC | PRN

## 2022-09-30 MED ORDER — OXYCODONE-ACETAMINOPHEN 5-325 MG PO TABS: 1.0000 | ORAL_TABLET | ORAL | Status: DC | PRN

## 2022-09-30 MED ORDER — LACTATED RINGERS IV SOLN: 500.0000 mL | Freq: Once | INTRAVENOUS | Status: DC

## 2022-09-30 MED ORDER — LIDOCAINE HCL (PF) 1 % IJ SOLN: 30.0000 mL | INTRAMUSCULAR | Status: DC | PRN

## 2022-09-30 MED ORDER — SODIUM CHLORIDE 0.9 % IV SOLN
2.0000 g | Freq: Once | INTRAVENOUS | Status: AC
  Administered 2022-09-30: 2 g via INTRAVENOUS
  Filled 2022-09-30: qty 2000

## 2022-09-30 MED ORDER — DIPHENHYDRAMINE HCL 50 MG/ML IJ SOLN: 12.5000 mg | INTRAMUSCULAR | Status: DC | PRN

## 2022-09-30 MED ORDER — METHYLERGONOVINE MALEATE 0.2 MG/ML IJ SOLN: 0.2000 mg | Freq: Once | INTRAMUSCULAR | Status: AC

## 2022-09-30 MED ORDER — ONDANSETRON HCL 4 MG/2ML IJ SOLN: 4.0000 mg | Freq: Four times a day (QID) | INTRAMUSCULAR | Status: DC | PRN

## 2022-09-30 MED ORDER — FENTANYL-BUPIVACAINE-NACL 0.5-0.125-0.9 MG/250ML-% EP SOLN
12.0000 mL/h | EPIDURAL | Status: DC | PRN
  Administered 2022-09-30: 12 mL/h via EPIDURAL
  Filled 2022-09-30: qty 250

## 2022-09-30 MED ORDER — ACETAMINOPHEN 325 MG PO TABS
650.0000 mg | ORAL_TABLET | ORAL | Status: DC | PRN
  Administered 2022-10-01: 650 mg via ORAL
  Filled 2022-09-30: qty 2

## 2022-09-30 NOTE — MAU Note (Signed)
.  Donna Bennett is a 22 y.o. at [redacted]w[redacted]d here in MAU reporting: ctx that started around 0500 this morning. Now they are every 3-5 minutes. Having some bloody show, but unsure about LOF.    Pain score: 9 Vitals:   09/30/22 1444  BP: (!) 134/90  Pulse: 86  Temp: 98.1 F (36.7 C)  SpO2: 100%

## 2022-09-30 NOTE — Anesthesia Procedure Notes (Signed)
Epidural Patient location during procedure: OB Start time: 09/30/2022 3:40 PM End time: 09/30/2022 3:50 PM  Staffing Anesthesiologist: Elmer Picker, MD Performed: anesthesiologist   Preanesthetic Checklist Completed: patient identified, IV checked, risks and benefits discussed, monitors and equipment checked, pre-op evaluation and timeout performed  Epidural Patient position: sitting Prep: DuraPrep and site prepped and draped Patient monitoring: continuous pulse ox, blood pressure, heart rate and cardiac monitor Approach: midline Location: L3-L4 Injection technique: LOR air  Needle:  Needle type: Tuohy  Needle gauge: 17 G Needle length: 9 cm Needle insertion depth: 5 cm Catheter type: closed end flexible Catheter size: 19 Gauge Catheter at skin depth: 10 cm Test dose: negative  Assessment Sensory level: T8 Events: blood not aspirated, no cerebrospinal fluid, injection not painful, no injection resistance, no paresthesia and negative IV test  Additional Notes Patient identified. Risks/Benefits/Options discussed with patient including but not limited to bleeding, infection, nerve damage, paralysis, failed block, incomplete pain control, headache, blood pressure changes, nausea, vomiting, reactions to medication both or allergic, itching and postpartum back pain. Confirmed with bedside nurse the patient's most recent platelet count. Confirmed with patient that they are not currently taking any anticoagulation, have any bleeding history or any family history of bleeding disorders. Patient expressed understanding and wished to proceed. All questions were answered. Sterile technique was used throughout the entire procedure. Please see nursing notes for vital signs. Test dose was given through epidural catheter and negative prior to continuing to dose epidural or start infusion. Warning signs of high block given to the patient including shortness of breath, tingling/numbness in hands,  complete motor block, or any concerning symptoms with instructions to call for help. Patient was given instructions on fall risk and not to get out of bed. All questions and concerns addressed with instructions to call with any issues or inadequate analgesia.  Reason for block:procedure for pain

## 2022-09-30 NOTE — Anesthesia Preprocedure Evaluation (Signed)
Anesthesia Evaluation  Patient identified by MRN, date of birth, ID band Patient awake    Reviewed: Allergy & Precautions, NPO status , Patient's Chart, lab work & pertinent test results  Airway Mallampati: II  TM Distance: >3 FB Neck ROM: Full    Dental no notable dental hx.    Pulmonary neg pulmonary ROS   Pulmonary exam normal breath sounds clear to auscultation       Cardiovascular negative cardio ROS Normal cardiovascular exam Rhythm:Regular Rate:Normal     Neuro/Psych negative neurological ROS  negative psych ROS   GI/Hepatic negative GI ROS, Neg liver ROS,,,  Endo/Other  negative endocrine ROS    Renal/GU negative Renal ROS  negative genitourinary   Musculoskeletal negative musculoskeletal ROS (+)    Abdominal   Peds  Hematology negative hematology ROS (+)   Anesthesia Other Findings Presented in labor   Reproductive/Obstetrics (+) Pregnancy                             Anesthesia Physical Anesthesia Plan  ASA: 2  Anesthesia Plan: Epidural   Post-op Pain Management:    Induction:   PONV Risk Score and Plan: Treatment may vary due to age or medical condition  Airway Management Planned: Natural Airway  Additional Equipment:   Intra-op Plan:   Post-operative Plan:   Informed Consent: I have reviewed the patients History and Physical, chart, labs and discussed the procedure including the risks, benefits and alternatives for the proposed anesthesia with the patient or authorized representative who has indicated his/her understanding and acceptance.       Plan Discussed with: Anesthesiologist  Anesthesia Plan Comments: (Patient identified. Risks, benefits, options discussed with patient including but not limited to bleeding, infection, nerve damage, paralysis, failed block, incomplete pain control, headache, blood pressure changes, nausea, vomiting, reactions to  medication, itching, and post partum back pain. Confirmed with bedside nurse the patient's most recent platelet count. Confirmed with the patient that they are not taking any anticoagulation, have any bleeding history or any family history of bleeding disorders. Patient expressed understanding and wishes to proceed. All questions were answered. )       Anesthesia Quick Evaluation

## 2022-09-30 NOTE — H&P (Signed)
Donna Bennett is a 22 y.o. female presenting for UCs. OB History     Gravida  2   Para      Term      Preterm      AB  1   Living         SAB      IAB      Ectopic      Multiple      Live Births             Past Medical History:  Diagnosis Date   Medical history non-contributory    Past Surgical History:  Procedure Laterality Date   KNEE SURGERY     TONSILLECTOMY     Family History: family history is not on file. Social History:  reports that she has never smoked. She has never used smokeless tobacco. She reports that she does not drink alcohol and does not use drugs.     Maternal Diabetes: No Genetic Screening: Normal Maternal Ultrasounds/Referrals: Normal Fetal Ultrasounds or other Referrals:  None Maternal Substance Abuse:  No Significant Maternal Medications:  None Significant Maternal Lab Results:  Group B Strep positive Number of Prenatal Visits:greater than 3 verified prenatal visits Other Comments:  None  Review of Systems  Constitutional:  Negative for fever.   Maternal Medical History:  Fetal activity: Perceived fetal activity is normal.     Dilation: 7 Effacement (%): 90 Station: -3 Exam by:: Holly Flippin RN Blood pressure (!) 134/90, pulse 86, temperature 98.1 F (36.7 C), temperature source Oral, last menstrual period 12/24/2021, SpO2 100%. Maternal Exam:  Abdomen: Fetal presentation: vertex   Physical Exam Cardiovascular:     Rate and Rhythm: Normal rate.  Pulmonary:     Effort: Pulmonary effort is normal.     Prenatal labs: ABO, Rh:   Antibody:   Rubella:   RPR:    HBsAg:    HIV:    GBS:   positive 03/03/22  Assessment/Plan: 22 yo G1P0 @ 38 5/7 in active labor Ampicilin for GBBS   Donna Bennett 09/30/2022, 3:04 PM

## 2022-09-30 NOTE — Progress Notes (Signed)
Operative Delivery Note At 11:25 PM a viable female was delivered via Vaginal, Spontaneous.  Presentation: vertex; Position: Right,, Occiput,, Anterior; Station: +4.  Verbal consent: obtained from patient.  Risks and benefits discussed in detail.  Risks include, but are not limited to the risks of anesthesia, bleeding, infection, damage to maternal tissues, fetal cephalhematoma.  There is also the risk of inability to effect vaginal delivery of the head, or shoulder dystocia that cannot be resolved by established maneuvers, leading to the need for emergency cesarean section. Pushing to crowning for about 1 hour MLE and VE D/W patient. She requests MLE first and then VE if needed Second degree MLE done. Two pushes with no change. Kiwi VE applied over two UCs, no pop offs. APGAR: 9, 9; weight  .   Placenta status: inatct, .   Cord:  3 vessels with the following complications: .  Cord pH:   Anesthesia:  epidural Instruments: Kiwi VE Episiotomy: second degree MLE repaired. Rectum checked>intact Lacerations:  none Suture Repair: 2.0 vicryl rapide Est. Blood Loss (mL):  350 Methergine 0.2mg  IM x 1 Mom to postpartum.  Baby to Couplet care / Skin to Skin.  Roselle Locus II 09/30/2022, 11:52 PM

## 2022-09-30 NOTE — Progress Notes (Signed)
FHT cat one UCs q4-5 min Cx 9/C/-2/vtx/BBOW AROM>clear

## 2022-10-01 ENCOUNTER — Encounter (HOSPITAL_COMMUNITY): Payer: Self-pay | Admitting: Obstetrics and Gynecology

## 2022-10-01 LAB — CBC
HCT: 31.8 % — ABNORMAL LOW (ref 36.0–46.0)
Hemoglobin: 10 g/dL — ABNORMAL LOW (ref 12.0–15.0)
MCH: 26.6 pg (ref 26.0–34.0)
MCHC: 31.4 g/dL (ref 30.0–36.0)
MCV: 84.6 fL (ref 80.0–100.0)
Platelets: 212 10*3/uL (ref 150–400)
RBC: 3.76 MIL/uL — ABNORMAL LOW (ref 3.87–5.11)
RDW: 14.1 % (ref 11.5–15.5)
WBC: 13.7 10*3/uL — ABNORMAL HIGH (ref 4.0–10.5)
nRBC: 0 % (ref 0.0–0.2)

## 2022-10-01 LAB — RPR: RPR Ser Ql: NONREACTIVE

## 2022-10-01 MED ORDER — OXYCODONE HCL 5 MG PO TABS: 5.0000 mg | ORAL_TABLET | ORAL | Status: DC | PRN

## 2022-10-01 MED ORDER — SIMETHICONE 80 MG PO CHEW: 80.0000 mg | CHEWABLE_TABLET | ORAL | Status: DC | PRN

## 2022-10-01 MED ORDER — PRENATAL MULTIVITAMIN CH
1.0000 | ORAL_TABLET | Freq: Every day | ORAL | Status: DC
  Administered 2022-10-01: 1 via ORAL
  Filled 2022-10-01: qty 1

## 2022-10-01 MED ORDER — ONDANSETRON HCL 4 MG/2ML IJ SOLN: 4.0000 mg | INTRAMUSCULAR | Status: DC | PRN

## 2022-10-01 MED ORDER — ZOLPIDEM TARTRATE 5 MG PO TABS: 5.0000 mg | ORAL_TABLET | Freq: Every evening | ORAL | Status: DC | PRN

## 2022-10-01 MED ORDER — COCONUT OIL OIL: 1.0000 | TOPICAL_OIL | Status: DC | PRN

## 2022-10-01 MED ORDER — OXYCODONE HCL 5 MG PO TABS: 10.0000 mg | ORAL_TABLET | ORAL | Status: DC | PRN

## 2022-10-01 MED ORDER — DIPHENHYDRAMINE HCL 25 MG PO CAPS: 25.0000 mg | ORAL_CAPSULE | Freq: Four times a day (QID) | ORAL | Status: DC | PRN

## 2022-10-01 MED ORDER — BENZOCAINE-MENTHOL 20-0.5 % EX AERO
1.0000 | INHALATION_SPRAY | CUTANEOUS | Status: DC | PRN
  Filled 2022-10-01: qty 56

## 2022-10-01 MED ORDER — TETANUS-DIPHTH-ACELL PERTUSSIS 5-2.5-18.5 LF-MCG/0.5 IM SUSY
0.5000 mL | PREFILLED_SYRINGE | Freq: Once | INTRAMUSCULAR | Status: DC
  Filled 2022-10-01: qty 0.5

## 2022-10-01 MED ORDER — ACETAMINOPHEN 325 MG PO TABS: 650.0000 mg | ORAL_TABLET | ORAL | Status: DC | PRN

## 2022-10-01 MED ORDER — WITCH HAZEL-GLYCERIN EX PADS: 1.0000 | MEDICATED_PAD | CUTANEOUS | Status: DC | PRN

## 2022-10-01 MED ORDER — ONDANSETRON HCL 4 MG PO TABS: 4.0000 mg | ORAL_TABLET | ORAL | Status: DC | PRN

## 2022-10-01 MED ORDER — IBUPROFEN 600 MG PO TABS
600.0000 mg | ORAL_TABLET | Freq: Four times a day (QID) | ORAL | Status: DC
  Administered 2022-10-01 – 2022-10-02 (×5): 600 mg via ORAL
  Filled 2022-10-01 (×5): qty 1

## 2022-10-01 MED ORDER — SENNOSIDES-DOCUSATE SODIUM 8.6-50 MG PO TABS
2.0000 | ORAL_TABLET | ORAL | Status: DC
  Administered 2022-10-01: 2 via ORAL
  Filled 2022-10-01: qty 2

## 2022-10-01 MED ORDER — DIBUCAINE (PERIANAL) 1 % EX OINT: 1.0000 | TOPICAL_OINTMENT | CUTANEOUS | Status: DC | PRN

## 2022-10-01 NOTE — Lactation Note (Signed)
This note was copied from a baby's chart. Lactation Consultation Note  Patient Name: Donna Bennett [id number] Date: 10/01/2022 Age:21 hours  Reason for consult: Initial assessment;Exclusive pumping and bottle feeding;Primapara;1st time breastfeeding;Early term 37-38.6wks  P1, [redacted]w[redacted]d  Mother receptive to [facility] visit. Mother has attempted to breast feed once per chart and all other feedings have been bottle fed formula (33 mL). Mother states she has decided to pump and bottle feed. She wants to use her pump that she brought from home and formula feed until her milk is in.  Discussed pumping every 3 hours for 15 min in a stimulation mode, if available, with her pump. Discussed the process of milk production, supply and demand and the importance of breast stimulation and milk removal. Instructed mother to do skin to skin and pump to supplement milk supply. Mother verbalized understanding. Has breast pump at home.   Mother instructed to call for assistance as needed. Mom made aware of O/P services, breastfeeding support groups,  and our phone # for post-discharge questions.     Maternal Data Has patient been taught Hand Expression?:  (unsure) Does the patient have breastfeeding experience prior to this delivery?: No  Feeding Mother's Current Feeding Choice: Breast Milk and Formula Nipple Type: Slow - flow    Interventions Interventions: Education  Discharge Discharge Education: Engorgement and breast care Pump: Hands Free (momcozy)  Consult Status Consult Status: Complete    Omar Person 10/01/2022, 5:32 PM

## 2022-10-01 NOTE — Progress Notes (Signed)
MOB was referred for history of depression.  * Referral screened out by Clinical Social Worker because none of the following criteria appear to apply:  ~ History of depression during this pregnancy, or of post-partum depression following prior delivery.  ~ Diagnosis of depression within last 3 years  Per OB notes, MOB did not indicate any signs/symptoms during pregnancy.  OR  * MOB's symptoms currently being treated with medication and/or therapy.  Please contact the Clinical Social Worker if needs arise, by MOB request, or if MOB scores greater than 9/yes to question 10 on Edinburgh Postpartum Depression Screen.  Ashley Jones, LCSWA Clinical Social Worker [phone] 

## 2022-10-01 NOTE — Plan of Care (Signed)

## 2022-10-01 NOTE — Progress Notes (Signed)
Postpartum Progress Note  Post Partum Day 1 s/p spontaneous vaginal delivery (09/30/22 23:25).  Patient reports well-controlled pain, ambulating without difficulty, voiding spontaneously, tolerating PO.  Vaginal bleeding is appropriate.   Objective: Blood pressure 112/78, pulse 76, temperature 98.9 F (37.2 C), temperature source Oral, resp. rate 19, height 5\' 3"  (1.6 m), weight 78 kg, last menstrual period 12/24/2021, SpO2 99%, unknown if currently breastfeeding.  Physical Exam:  General: alert and no distress Lochia: appropriate Uterine Fundus: firm DVT Evaluation: No evidence of DVT seen on physical exam.  Recent Labs    09/30/22 1454 10/01/22 0620  HGB 11.0* 10.0*  HCT 34.9* 31.8*    Assessment/Plan: Postpartum Day 1, s/p vaginal delivery. Continue routine postpartum care Lactation following Baby boy - desires circ. Will perform when cleared by nursery.  Anticipate discharge home PPD#2   LOS: 1 day   Lyn Henri 10/01/2022, 7:40 AM

## 2022-10-01 NOTE — Lactation Note (Signed)
This note was copied from a baby's chart. Lactation Consultation Note RN stated mom is very tired would like to be seen in am.  Patient Name: Donna Bennett [id number] Date: 10/01/2022 Age:21 hours     Maternal Data    Feeding    LATCH Score Latch: Too sleepy or reluctant, no latch achieved, no sucking elicited.  Audible Swallowing: None  Type of Nipple: Everted at rest and after stimulation  Comfort (Breast/Nipple): Soft / non-tender  Hold (Positioning): Assistance needed to correctly position infant at breast and maintain latch.  LATCH Score: 5   Lactation Tools Discussed/Used    Interventions    Discharge    Consult Status      Charyl Dancer 10/01/2022, 4:53 AM

## 2022-10-02 NOTE — Plan of Care (Signed)

## 2022-10-02 NOTE — Discharge Instructions (Signed)
WHAT TO LOOK OUT FOR: Fever of 100.4 or above Mastitis: feels like flu and breasts hurt Infection: increased pain, swelling or redness Blood clots golf ball size or larger Postpartum depression   Congratulations on your newest addition!

## 2022-10-02 NOTE — Anesthesia Postprocedure Evaluation (Signed)
Anesthesia Post Note  Patient: Engineer, maintenance (IT)  Procedure(s) Performed: AN AD HOC LABOR EPIDURAL     Patient location during evaluation: Mother Baby Anesthesia Type: Epidural Level of consciousness: awake, oriented and awake and alert Pain management: pain level controlled Vital Signs Assessment: post-procedure vital signs reviewed and stable Respiratory status: spontaneous breathing, respiratory function stable and nonlabored ventilation Cardiovascular status: stable Postop Assessment: no headache, adequate PO intake, able to ambulate, patient able to bend at knees and no apparent nausea or vomiting Anesthetic complications: no   No notable events documented.  Last Vitals:  Vitals:   10/01/22 2047 10/02/22 0606  BP: 111/63 112/62  Pulse: 79 74  Resp: 17 16  Temp: 36.8 C 36.8 C  SpO2: 99% 99%    Last Pain:  Vitals:   10/02/22 0855  TempSrc:   PainSc: 0-No pain   Pain Goal:                Epidural/Spinal Function Cutaneous sensation: Normal sensation (10/02/22 0855), Patient able to flex knees: Yes (10/02/22 0855), Patient able to lift hips off bed: Yes (10/02/22 0855), Back pain beyond tenderness at insertion site: No (10/02/22 0855), Progressively worsening motor and/or sensory loss: No (10/02/22 0855), Bowel and/or bladder incontinence post epidural: No (10/02/22 0855)  Moya Duan

## 2022-10-02 NOTE — Plan of Care (Signed)
Problem: Education: Goal: Knowledge of General Education information will improve Description: Including pain rating scale, medication(s)/side effects and non-pharmacologic comfort measures 10/02/2022 0858 by Donne Hazel, LPN Outcome: Adequate for Discharge 10/02/2022 0732 by Donne Hazel, LPN Outcome: Progressing   Problem: Health Behavior/Discharge Planning: Goal: Ability to manage health-related needs will improve 10/02/2022 0858 by Donne Hazel, LPN Outcome: Adequate for Discharge 10/02/2022 0732 by Donne Hazel, LPN Outcome: Progressing   Problem: Clinical Measurements: Goal: Ability to maintain clinical measurements within normal limits will improve 10/02/2022 0858 by Donne Hazel, LPN Outcome: Adequate for Discharge 10/02/2022 0732 by Donne Hazel, LPN Outcome: Progressing Goal: Will remain free from infection 10/02/2022 0858 by Donne Hazel, LPN Outcome: Adequate for Discharge 10/02/2022 0732 by Donne Hazel, LPN Outcome: Progressing Goal: Diagnostic test results will improve 10/02/2022 0858 by Donne Hazel, LPN Outcome: Adequate for Discharge 10/02/2022 0732 by Donne Hazel, LPN Outcome: Progressing Goal: Respiratory complications will improve 10/02/2022 0858 by Donne Hazel, LPN Outcome: Adequate for Discharge 10/02/2022 0732 by Donne Hazel, LPN Outcome: Progressing Goal: Cardiovascular complication will be avoided 10/02/2022 0858 by Donne Hazel, LPN Outcome: Adequate for Discharge 10/02/2022 0732 by Donne Hazel, LPN Outcome: Progressing   Problem: Activity: Goal: Risk for activity intolerance will decrease 10/02/2022 0858 by Donne Hazel, LPN Outcome: Adequate for Discharge 10/02/2022 0732 by Donne Hazel, LPN Outcome: Progressing   Problem: Nutrition: Goal: Adequate nutrition will be maintained 10/02/2022 0858 by Donne Hazel, LPN Outcome: Adequate for Discharge 10/02/2022 0732 by Donne Hazel, LPN Outcome: Progressing    Problem: Coping: Goal: Level of anxiety will decrease 10/02/2022 0858 by Donne Hazel, LPN Outcome: Adequate for Discharge 10/02/2022 0732 by Donne Hazel, LPN Outcome: Progressing   Problem: Elimination: Goal: Will not experience complications related to bowel motility 10/02/2022 0858 by Donne Hazel, LPN Outcome: Adequate for Discharge 10/02/2022 0732 by Donne Hazel, LPN Outcome: Progressing Goal: Will not experience complications related to urinary retention 10/02/2022 0858 by Donne Hazel, LPN Outcome: Adequate for Discharge 10/02/2022 0732 by Donne Hazel, LPN Outcome: Progressing   Problem: Pain Managment: Goal: General experience of comfort will improve 10/02/2022 0858 by Donne Hazel, LPN Outcome: Adequate for Discharge 10/02/2022 0732 by Donne Hazel, LPN Outcome: Progressing   Problem: Safety: Goal: Ability to remain free from injury will improve 10/02/2022 0858 by Donne Hazel, LPN Outcome: Adequate for Discharge 10/02/2022 0732 by Donne Hazel, LPN Outcome: Progressing   Problem: Skin Integrity: Goal: Risk for impaired skin integrity will decrease 10/02/2022 0858 by Donne Hazel, LPN Outcome: Adequate for Discharge 10/02/2022 0732 by Donne Hazel, LPN Outcome: Progressing   Problem: Education: Goal: Knowledge of Childbirth will improve 10/02/2022 0858 by Donne Hazel, LPN Outcome: Adequate for Discharge 10/02/2022 0732 by Donne Hazel, LPN Outcome: Progressing Goal: Ability to make informed decisions regarding treatment and plan of care will improve 10/02/2022 0858 by Donne Hazel, LPN Outcome: Adequate for Discharge 10/02/2022 0732 by Donne Hazel, LPN Outcome: Progressing Goal: Ability to state and carry out methods to decrease the pain will improve 10/02/2022 0858 by Donne Hazel, LPN Outcome: Adequate for Discharge 10/02/2022 0732 by Donne Hazel, LPN Outcome: Progressing Goal: Individualized Educational Video(s) 10/02/2022  0858 by Donne Hazel, LPN Outcome: Adequate for Discharge 10/02/2022 0732 by Donne Hazel, LPN Outcome: Progressing   Problem: Coping: Goal: Ability to verbalize concerns and feelings  about labor and delivery will improve 10/02/2022 0858 by Donne Hazel, LPN Outcome: Adequate for Discharge 10/02/2022 0732 by Donne Hazel, LPN Outcome: Progressing   Problem: Life Cycle: Goal: Ability to make normal progression through stages of labor will improve 10/02/2022 0858 by Donne Hazel, LPN Outcome: Adequate for Discharge 10/02/2022 0732 by Donne Hazel, LPN Outcome: Progressing Goal: Ability to effectively push during vaginal delivery will improve 10/02/2022 0858 by Donne Hazel, LPN Outcome: Adequate for Discharge 10/02/2022 0732 by Donne Hazel, LPN Outcome: Progressing   Problem: Role Relationship: Goal: Will demonstrate positive interactions with the child 10/02/2022 6213 by Donne Hazel, LPN Outcome: Adequate for Discharge 10/02/2022 0732 by Donne Hazel, LPN Outcome: Progressing   Problem: Safety: Goal: Risk of complications during labor and delivery will decrease 10/02/2022 0858 by Donne Hazel, LPN Outcome: Adequate for Discharge 10/02/2022 0732 by Donne Hazel, LPN Outcome: Progressing   Problem: Pain Management: Goal: Relief or control of pain from uterine contractions will improve 10/02/2022 0858 by Donne Hazel, LPN Outcome: Adequate for Discharge 10/02/2022 0732 by Donne Hazel, LPN Outcome: Progressing   Problem: Education: Goal: Knowledge of condition will improve 10/02/2022 0858 by Donne Hazel, LPN Outcome: Adequate for Discharge 10/02/2022 0732 by Donne Hazel, LPN Outcome: Progressing Goal: Individualized Educational Video(s) 10/02/2022 0858 by Donne Hazel, LPN Outcome: Adequate for Discharge 10/02/2022 0732 by Donne Hazel, LPN Outcome: Progressing Goal: Individualized Newborn Educational Video(s) 10/02/2022 0858 by Donne Hazel, LPN Outcome: Adequate for Discharge 10/02/2022 0732 by Donne Hazel, LPN Outcome: Progressing   Problem: Activity: Goal: Will verbalize the importance of balancing activity with adequate rest periods 10/02/2022 0858 by Donne Hazel, LPN Outcome: Adequate for Discharge 10/02/2022 0732 by Donne Hazel, LPN Outcome: Progressing Goal: Ability to tolerate increased activity will improve 10/02/2022 0858 by Donne Hazel, LPN Outcome: Adequate for Discharge 10/02/2022 0732 by Donne Hazel, LPN Outcome: Progressing   Problem: Coping: Goal: Ability to identify and utilize available resources and services will improve 10/02/2022 0858 by Donne Hazel, LPN Outcome: Adequate for Discharge 10/02/2022 0732 by Donne Hazel, LPN Outcome: Progressing   Problem: Life Cycle: Goal: Chance of risk for complications during the postpartum period will decrease 10/02/2022 0858 by Donne Hazel, LPN Outcome: Adequate for Discharge 10/02/2022 0732 by Donne Hazel, LPN Outcome: Progressing   Problem: Role Relationship: Goal: Ability to demonstrate positive interaction with newborn will improve 10/02/2022 0858 by Donne Hazel, LPN Outcome: Adequate for Discharge 10/02/2022 0732 by Donne Hazel, LPN Outcome: Progressing   Problem: Skin Integrity: Goal: Demonstration of wound healing without infection will improve 10/02/2022 0858 by Donne Hazel, LPN Outcome: Adequate for Discharge 10/02/2022 0732 by Donne Hazel, LPN Outcome: Progressing

## 2022-10-02 NOTE — Discharge Summary (Signed)
Postpartum Discharge Summary  Date of Service updated 10/02/22     Patient Name: Donna Bennett DOB: 2000-09-03 MRN: [id number]  Date of admission: 09/30/2022 Delivery date:09/30/2022 Delivering provider: Harold Hedge Date of discharge: 10/02/2022  Admitting diagnosis: Normal labor [O80, Z37.9] Term pregnancy [Z34.90] Intrauterine pregnancy: [redacted]w[redacted]d     Secondary diagnosis:  Principal Problem:   Normal labor Active Problems:   Term pregnancy  Additional problems: None    Discharge diagnosis: Term Pregnancy Delivered                                              Post partum procedures: None Augmentation: AROM Complications: None  Hospital course: Onset of Labor With Vaginal Delivery      22 y.o. yo G2P1011 at [redacted]w[redacted]d was admitted in Latent Labor on 09/30/2022. Labor course was complicated by none.  Membrane Rupture Time/Date: 4:53 PM,09/30/2022  Delivery Method:Vaginal, Vacuum (Extractor) Operative Delivery:Device used:kiwi Indication: Maternal exhaustion Episiotomy: Median Lacerations:  2nd degree Patient had a postpartum course complicated by none.  She is ambulating, tolerating a regular diet, passing flatus, and urinating well. Patient is discharged home in stable condition on 10/02/22.  Newborn Data: Birth date:09/30/2022 Birth time:11:25 PM Gender:Female Living status:Living Apgars:9 ,9  Weight:3850 g  Magnesium Sulfate received: No BMZ received: No Rhophylac:N/A MMR:N/A T-DaP:Given prenatally Flu: N/A Transfusion:No  Physical exam  Vitals:   10/01/22 1030 10/01/22 1430 10/01/22 2047 10/02/22 0606  BP: 112/69 114/80 111/63 112/62  Pulse: 82 76 79 74  Resp: 18 18 17 16   Temp: 98.4 F (36.9 C) 98.2 F (36.8 C) 98.3 F (36.8 C) 98.3 F (36.8 C)  TempSrc: Oral Oral Oral Oral  SpO2: 100% 100% 99% 99%  Weight:      Height:       General: alert Lochia: appropriate Uterine Fundus: firm Incision: N/A DVT Evaluation: No evidence of DVT seen on physical  exam. Labs: Lab Results  Component Value Date   WBC 13.7 (H) 10/01/2022   HGB 10.0 (L) 10/01/2022   HCT 31.8 (L) 10/01/2022   MCV 84.6 10/01/2022   PLT 212 10/01/2022      Latest Ref Rng & Units 09/24/2022    3:33 PM  CMP  Glucose 70 - 99 mg/dL 99   BUN 6 - 20 mg/dL 5   Creatinine 1.76 - 1.60 mg/dL 7.37   Sodium 106 - 269 mmol/L 135   Potassium 3.5 - 5.1 mmol/L 3.7   Chloride 98 - 111 mmol/L 105   CO2 22 - 32 mmol/L 20   Calcium 8.9 - 10.3 mg/dL 8.8   Total Protein 6.5 - 8.1 g/dL 6.4   Total Bilirubin 0.3 - 1.2 mg/dL 0.3   Alkaline Phos 38 - 126 U/L 187   AST 15 - 41 U/L 17   ALT 0 - 44 U/L 12    Edinburgh Score:    10/01/2022   10:30 AM  Edinburgh Postnatal Depression Scale Screening Tool  I have been able to laugh and see the funny side of things. 0  I have looked forward with enjoyment to things. 0  I have blamed myself unnecessarily when things went wrong. 1  I have been anxious or worried for no good reason. 0  I have felt scared or panicky for no good reason. 0  Things have been getting on top of me. 0  I have been so unhappy that I have had difficulty sleeping. 0  I have felt sad or miserable. 0  I have been so unhappy that I have been crying. 0  The thought of harming myself has occurred to me. 0  Edinburgh Postnatal Depression Scale Total 1      After visit meds:  Allergies as of 10/02/2022   No Known Allergies      Medication List     STOP taking these medications    terconazole 0.8 % vaginal cream Commonly known as: TERAZOL 3       TAKE these medications    multivitamin-prenatal 27-0.8 MG Tabs tablet Take 1 tablet by mouth daily at 12 noon.        D/W patient female infant circumcision, risks/benefits reviewed. All questions answered.   Discharge home in stable condition Infant Feeding: Bottle and Breast Infant Disposition:home with mother Discharge instruction: per After Visit Summary and Postpartum booklet. Activity: Advance as  tolerated. Pelvic rest for 6 weeks.  Diet: routine diet Anticipated Birth Control: Unsure Postpartum Appointment:6 weeks Additional Postpartum F/U:  None Future Appointments:No future appointments. Follow up Visit:      10/02/2022 Ranae Pila, MD

## 2022-10-06 DIAGNOSIS — M5489 Other dorsalgia: Secondary | ICD-10-CM | POA: Diagnosis not present

## 2022-10-29 ENCOUNTER — Telehealth (HOSPITAL_COMMUNITY): Payer: Self-pay | Admitting: *Deleted

## 2022-10-29 NOTE — Telephone Encounter (Signed)
10/29/2022  Name: Janicia Omar MRN: [id number] DOB: 02-May-2000  Reason for Call:  Transition of Care Hospital Discharge Call  Contact Status: Patient Contact Status: Unable to contact ("voice mail box not set up")  Language assistant needed:          Follow-Up Questions:    Inocente Salles Postnatal Depression Scale:  In the Past 7 Days:    PHQ2-9 Depression Scale:     Discharge Follow-up:    Post-discharge interventions: NA  Salena Saner, RN 10/29/2022 13:51

## 2022-10-30 ENCOUNTER — Other Ambulatory Visit: Payer: Self-pay

## 2022-10-30 ENCOUNTER — Emergency Department (HOSPITAL_BASED_OUTPATIENT_CLINIC_OR_DEPARTMENT_OTHER)
Admission: EM | Admit: 2022-10-30 | Discharge: 2022-10-30 | Disposition: A | Discharge status: Home / Self Care | Payer: Federal, State, Local not specified - PPO | Attending: Emergency Medicine | Admitting: Emergency Medicine

## 2022-10-30 ENCOUNTER — Encounter (HOSPITAL_BASED_OUTPATIENT_CLINIC_OR_DEPARTMENT_OTHER): Payer: Self-pay

## 2022-10-30 DIAGNOSIS — N939 Abnormal uterine and vaginal bleeding, unspecified: Secondary | ICD-10-CM | POA: Diagnosis not present

## 2022-10-30 LAB — BASIC METABOLIC PANEL
Anion gap: 12 (ref 5–15)
BUN: 14 mg/dL (ref 6–20)
CO2: 22 mmol/L (ref 22–32)
Calcium: 8.8 mg/dL — ABNORMAL LOW (ref 8.9–10.3)
Chloride: 103 mmol/L (ref 98–111)
Creatinine, Ser: 0.55 mg/dL (ref 0.44–1.00)
GFR, Estimated: >60 mL/min (ref >60)
Glucose, Bld: 120 mg/dL — ABNORMAL HIGH (ref 70–99)
Potassium: 3.5 mmol/L (ref 3.5–5.1)
Sodium: 137 mmol/L (ref 135–145)

## 2022-10-30 LAB — CBC WITH DIFFERENTIAL/PLATELET
Abs Immature Granulocytes: 0.02 10*3/uL (ref 0.00–0.07)
Basophils Absolute: 0 10*3/uL (ref 0.0–0.1)
Basophils Relative: 1 %
Eosinophils Absolute: 0.2 10*3/uL (ref 0.0–0.5)
Eosinophils Relative: 2 %
HCT: 34.7 % — ABNORMAL LOW (ref 36.0–46.0)
Hemoglobin: 10.6 g/dL — ABNORMAL LOW (ref 12.0–15.0)
Immature Granulocytes: 0 %
Lymphocytes Relative: 40 %
Lymphs Abs: 3.2 10*3/uL (ref 0.7–4.0)
MCH: 26.1 pg (ref 26.0–34.0)
MCHC: 30.5 g/dL (ref 30.0–36.0)
MCV: 85.5 fL (ref 80.0–100.0)
Monocytes Absolute: 0.6 10*3/uL (ref 0.1–1.0)
Monocytes Relative: 7 %
Neutro Abs: 4.2 10*3/uL (ref 1.7–7.7)
Neutrophils Relative %: 50 %
Platelets: 305 10*3/uL (ref 150–400)
RBC: 4.06 MIL/uL (ref 3.87–5.11)
RDW: 14.6 % (ref 11.5–15.5)
WBC: 8.2 10*3/uL (ref 4.0–10.5)
nRBC: 0 % (ref 0.0–0.2)

## 2022-10-30 NOTE — ED Triage Notes (Signed)
Pt states she had a baby 4 weeks ago Had stopped bleeding 2 weeks ago and started bleeding Tuesday and has increased everyday.   Passing clots and changing pad every hr No other symptoms

## 2022-10-30 NOTE — ED Provider Notes (Signed)
[place] [facility] Provider Note   CSN: [id number] Arrival date & time: 10/30/22  0229     History  Chief Complaint  Patient presents with   Vaginal Bleeding    Donna Bennett is a 22 y.o. female.  Patient is a 22 year old female 4 weeks postpartum from vaginal delivery of her first child.  Patient presenting with vaginal bleeding.  She bled for 2 weeks after delivery, and bleeding resolved.  The bleeding resumed 3 days ago and she is now changing a pad every 1-2 hours.  She denies to me she is having any discomfort.  No fevers or chills.  No aggravating or alleviating factors.  The history is provided by the patient.       Home Medications Prior to Admission medications   Medication Sig Start Date End Date Taking? Authorizing Provider  Prenatal Vit-Fe Fumarate-FA (MULTIVITAMIN-PRENATAL) 27-0.8 MG TABS tablet Take 1 tablet by mouth daily at 12 noon.    [provider]      Allergies    Patient has no known allergies.    Review of Systems   Review of Systems  All other systems reviewed and are negative.   Physical Exam Updated Vital Signs BP 130/77   Pulse 94   Temp 99.1 F (37.3 C) (Tympanic)   Resp 18   Ht 5\' 3"  (1.6 m)   Wt 69.9 kg   LMP 12/24/2021 (Exact Date) Comment: began bleeding Tuesday  SpO2 100%   BMI 27.28 kg/m  Physical Exam Vitals and nursing note reviewed.  Constitutional:      General: She is not in acute distress.    Appearance: She is well-developed. She is not diaphoretic.  HENT:     Head: Normocephalic and atraumatic.  Cardiovascular:     Rate and Rhythm: Normal rate and regular rhythm.     Heart sounds: No murmur heard.    No friction rub. No gallop.  Pulmonary:     Effort: Pulmonary effort is normal. No respiratory distress.     Breath sounds: Normal breath sounds. No wheezing.  Abdominal:     General: Bowel sounds are normal. There is no distension.     Palpations: Abdomen is  soft.     Tenderness: There is no abdominal tenderness.  Musculoskeletal:        General: Normal range of motion.     Cervical back: Normal range of motion and neck supple.  Skin:    General: Skin is warm and dry.  Neurological:     General: No focal deficit present.     Mental Status: She is alert and oriented to person, place, and time.     ED Results / Procedures / Treatments   Labs (all labs ordered are listed, but only abnormal results are displayed) Labs Reviewed  BASIC METABOLIC PANEL - Abnormal; Notable for the following components:      Result Value   Glucose, Bld 120 (*)    Calcium 8.8 (*)    All other components within normal limits  CBC WITH DIFFERENTIAL/PLATELET - Abnormal; Notable for the following components:   Hemoglobin 10.6 (*)    HCT 34.7 (*)    All other components within normal limits    EKG None  Radiology No results found.  Procedures Procedures    Medications Ordered in ED Medications - No data to display  ED Course/ Medical Decision Making/ A&P  Patient presenting with vaginal bleeding recurring 4 weeks postpartum.  Patient  arrives with stable vital signs and is afebrile.  She is not having any active hemorrhage or bleeding while in the ER.  Patient's physical examination is basically unremarkable.  Vital signs are stable.  CBC and basic metabolic obtained.  Hemoglobin is 10.6 which is actually up from 10 upon discharge.  Her white count is 8.2.  Care discussed with Dr. Vincente Poli from OB/GYN.  She is recommending patient call the office at 830 and arrange an appointment for this morning.  She will undergo ultrasound at that time.  Patient to be discharged in stable condition.  Final Clinical Impression(s) / ED Diagnoses Final diagnoses:  None    Rx / DC Orders ED Discharge Orders     None         Geoffery Lyons, MD 10/30/22 [id number]

## 2022-10-30 NOTE — Discharge Instructions (Signed)
Call Dr. Huel Coventry office this morning to arrange an ultrasound and follow-up appointment for today.  Dr. Vincente Poli is aware of your situation and what you seen today.

## 2022-12-23 DIAGNOSIS — S0083XA Contusion of other part of head, initial encounter: Secondary | ICD-10-CM | POA: Diagnosis not present

## 2022-12-23 DIAGNOSIS — S199XXA Unspecified injury of neck, initial encounter: Secondary | ICD-10-CM | POA: Diagnosis not present

## 2022-12-23 DIAGNOSIS — S0993XA Unspecified injury of face, initial encounter: Secondary | ICD-10-CM | POA: Diagnosis not present

## 2022-12-23 DIAGNOSIS — S0003XA Contusion of scalp, initial encounter: Secondary | ICD-10-CM | POA: Diagnosis not present

## 2022-12-23 DIAGNOSIS — S3991XA Unspecified injury of abdomen, initial encounter: Secondary | ICD-10-CM | POA: Diagnosis not present

## 2022-12-23 DIAGNOSIS — Y92009 Unspecified place in unspecified non-institutional (private) residence as the place of occurrence of the external cause: Secondary | ICD-10-CM | POA: Diagnosis not present

## 2022-12-23 DIAGNOSIS — O9A219 Injury, poisoning and certain other consequences of external causes complicating pregnancy, unspecified trimester: Secondary | ICD-10-CM | POA: Diagnosis not present

## 2022-12-23 DIAGNOSIS — O26891 Other specified pregnancy related conditions, first trimester: Secondary | ICD-10-CM | POA: Diagnosis not present

## 2022-12-23 DIAGNOSIS — R220 Localized swelling, mass and lump, head: Secondary | ICD-10-CM | POA: Diagnosis not present

## 2022-12-23 DIAGNOSIS — S301XXA Contusion of abdominal wall, initial encounter: Secondary | ICD-10-CM | POA: Diagnosis not present

## 2022-12-23 DIAGNOSIS — S01511A Laceration without foreign body of lip, initial encounter: Secondary | ICD-10-CM | POA: Diagnosis not present

## 2022-12-23 DIAGNOSIS — S299XXA Unspecified injury of thorax, initial encounter: Secondary | ICD-10-CM | POA: Diagnosis not present

## 2022-12-23 DIAGNOSIS — Z3490 Encounter for supervision of normal pregnancy, unspecified, unspecified trimester: Secondary | ICD-10-CM | POA: Insufficient documentation

## 2022-12-23 DIAGNOSIS — S01312A Laceration without foreign body of left ear, initial encounter: Secondary | ICD-10-CM | POA: Diagnosis not present

## 2022-12-23 DIAGNOSIS — R109 Unspecified abdominal pain: Secondary | ICD-10-CM | POA: Diagnosis not present

## 2022-12-23 DIAGNOSIS — Z3A00 Weeks of gestation of pregnancy not specified: Secondary | ICD-10-CM | POA: Diagnosis not present

## 2022-12-23 DIAGNOSIS — T07XXXA Unspecified multiple injuries, initial encounter: Secondary | ICD-10-CM | POA: Diagnosis not present

## 2022-12-23 DIAGNOSIS — R000 Tachycardia, unspecified: Secondary | ICD-10-CM | POA: Diagnosis not present

## 2022-12-23 DIAGNOSIS — Z3A01 Less than 8 weeks gestation of pregnancy: Secondary | ICD-10-CM | POA: Diagnosis not present

## 2022-12-23 DIAGNOSIS — O99111 Other diseases of the blood and blood-forming organs and certain disorders involving the immune mechanism complicating pregnancy, first trimester: Secondary | ICD-10-CM | POA: Diagnosis not present

## 2022-12-23 DIAGNOSIS — O9A211 Injury, poisoning and certain other consequences of external causes complicating pregnancy, first trimester: Secondary | ICD-10-CM | POA: Diagnosis not present

## 2022-12-23 DIAGNOSIS — D72829 Elevated white blood cell count, unspecified: Secondary | ICD-10-CM | POA: Diagnosis not present

## 2022-12-23 DIAGNOSIS — H1131 Conjunctival hemorrhage, right eye: Secondary | ICD-10-CM | POA: Diagnosis not present

## 2022-12-23 DIAGNOSIS — R42 Dizziness and giddiness: Secondary | ICD-10-CM | POA: Diagnosis not present

## 2022-12-24 DIAGNOSIS — S0993XA Unspecified injury of face, initial encounter: Secondary | ICD-10-CM | POA: Diagnosis not present

## 2022-12-24 DIAGNOSIS — R109 Unspecified abdominal pain: Secondary | ICD-10-CM | POA: Diagnosis not present

## 2022-12-25 DIAGNOSIS — S0993XA Unspecified injury of face, initial encounter: Secondary | ICD-10-CM | POA: Diagnosis not present

## 2022-12-29 DIAGNOSIS — F0781 Postconcussional syndrome: Secondary | ICD-10-CM | POA: Diagnosis not present

## 2022-12-29 IMAGING — CT CT RENAL STONE PROTOCOL
2 of 4 series · 16 of 46 positions shown, 18 images · non-contrast
Comparison: None.

CLINICAL DATA: Nausea, vomiting, chills, right side abdominal pain.



[Series 2: axial st · axial · 0.85mm/px · z∈[-432,+13]mm · 13 of 97 slices shown, 15 images]
[im 4/97  soft-tissue]
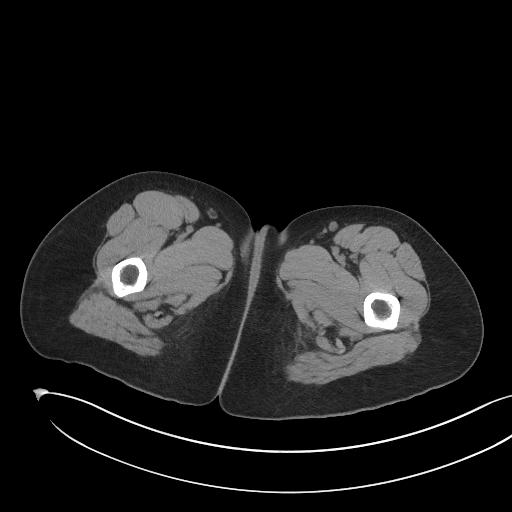
[im 4/97  bone]
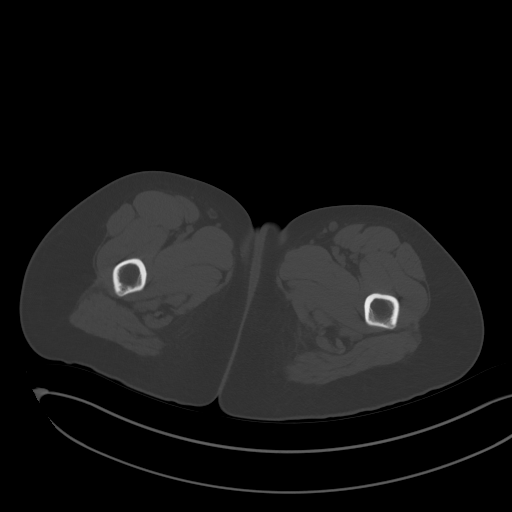
[im 12/97  soft-tissue]
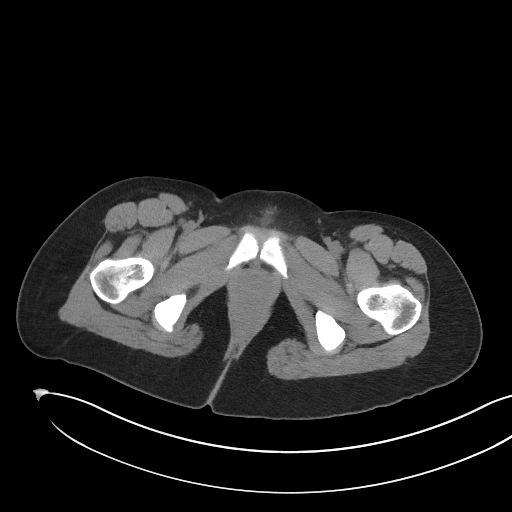
[im 19/97  soft-tissue]
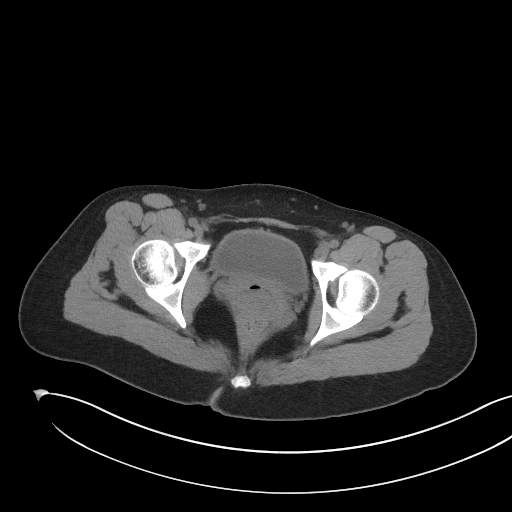
[im 26/97  soft-tissue]
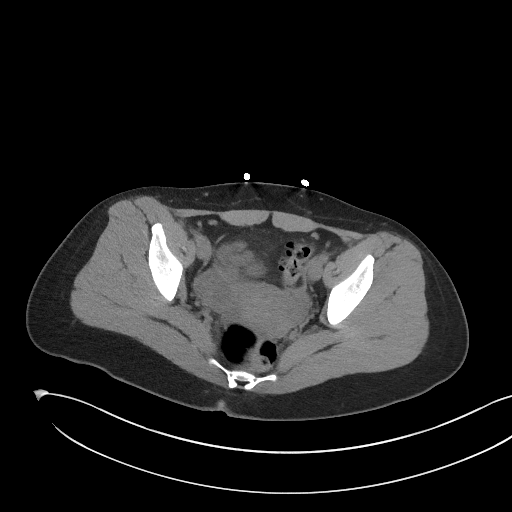
[im 34/97  soft-tissue]
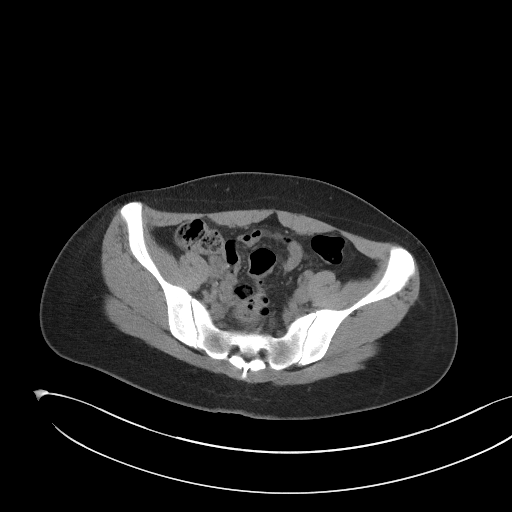
[im 41/97  soft-tissue]
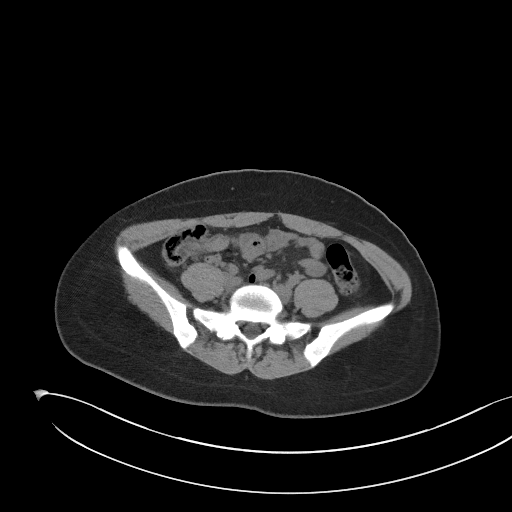
[im 49/97  soft-tissue]
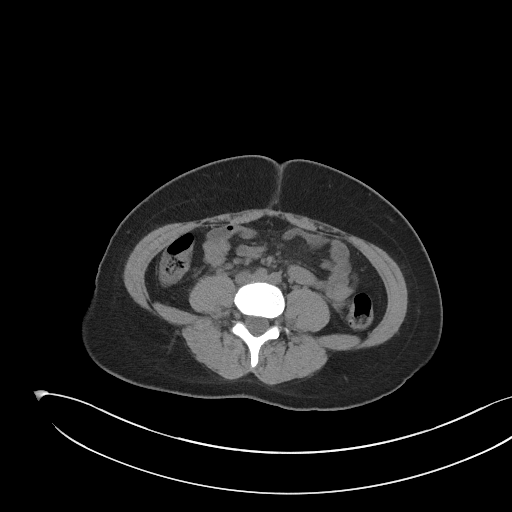
[im 56/97  soft-tissue]
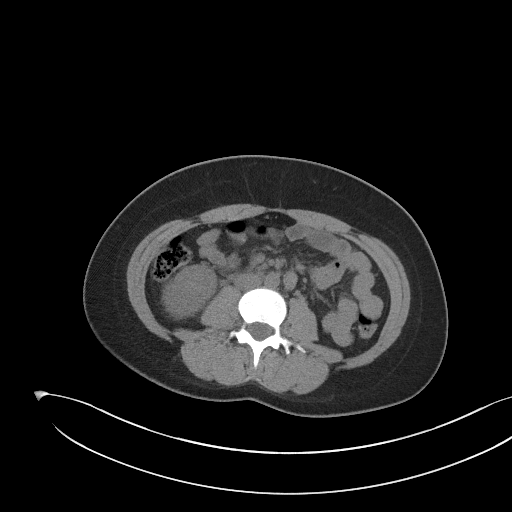
[im 63/97  soft-tissue]
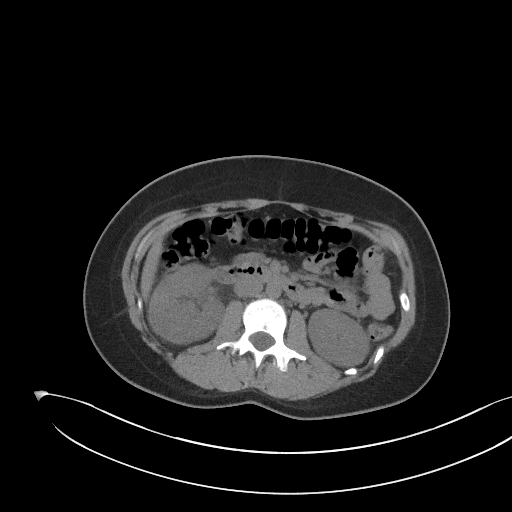
[im 63/97  bone]
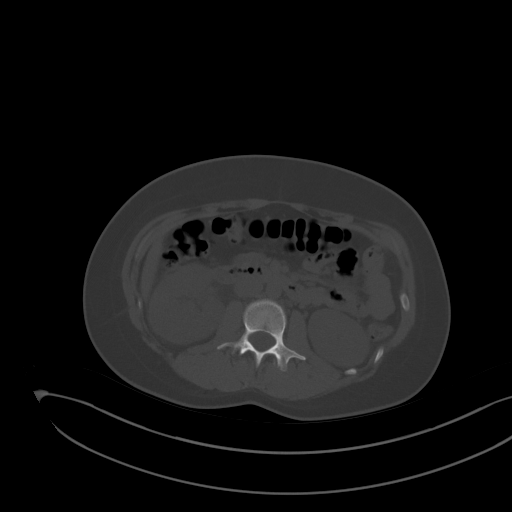
[im 71/97  soft-tissue]
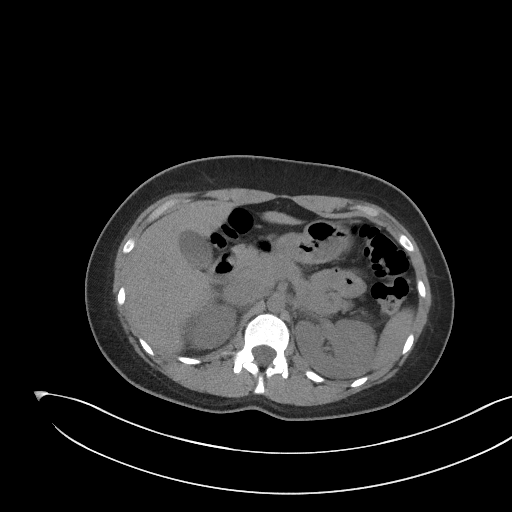
[im 78/97  soft-tissue]
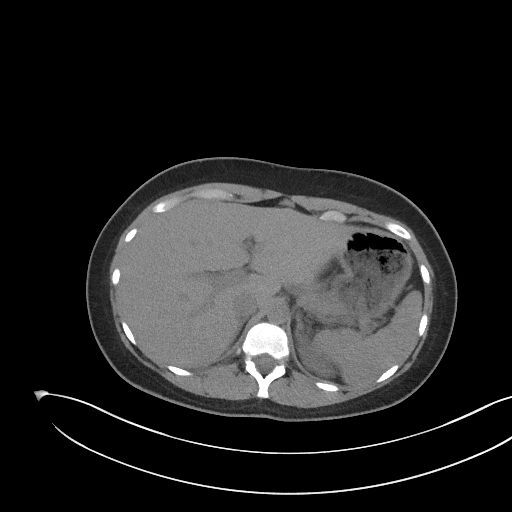
[im 85/97  soft-tissue]
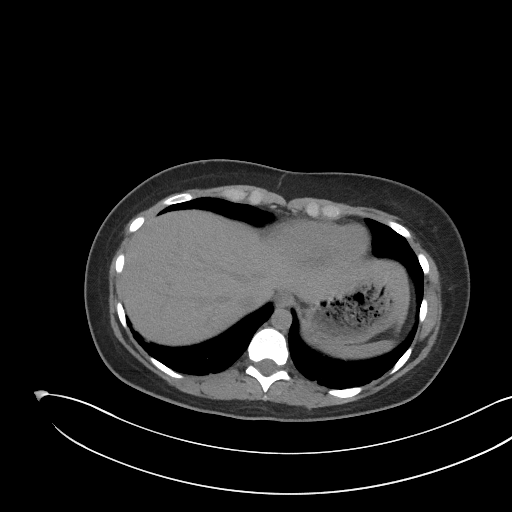
[im 93/97  soft-tissue]
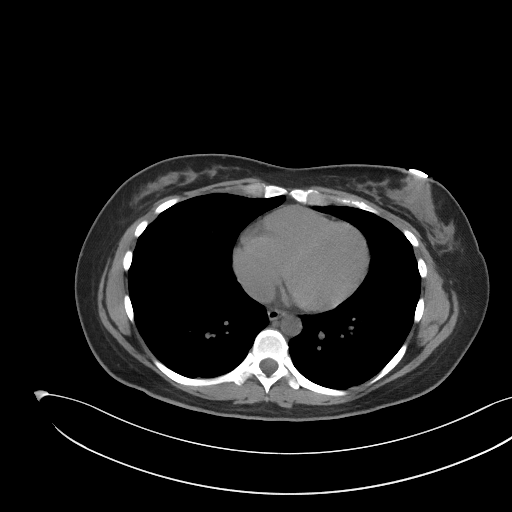

[Series 5: coronal st · coronal · 0.71mm/px · 3 of 72 slices shown]
[im 24/72  soft-tissue]
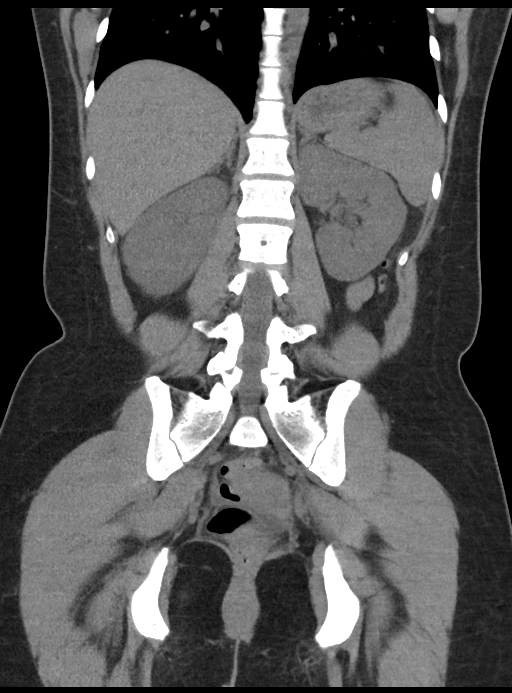
[im 32/72  soft-tissue]
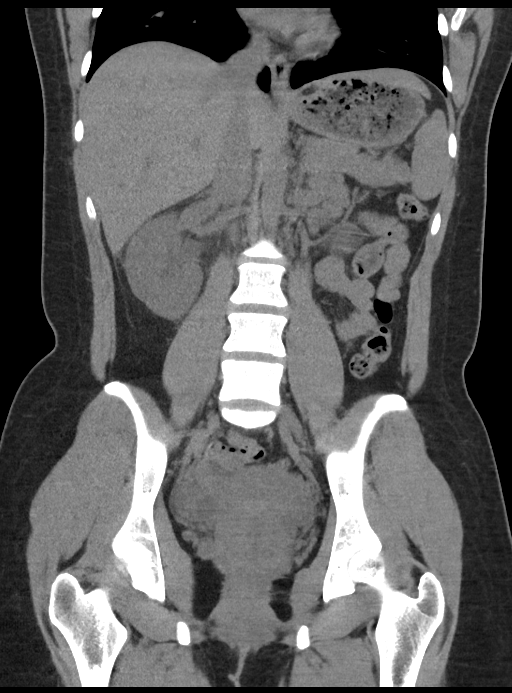
[im 40/72  soft-tissue]
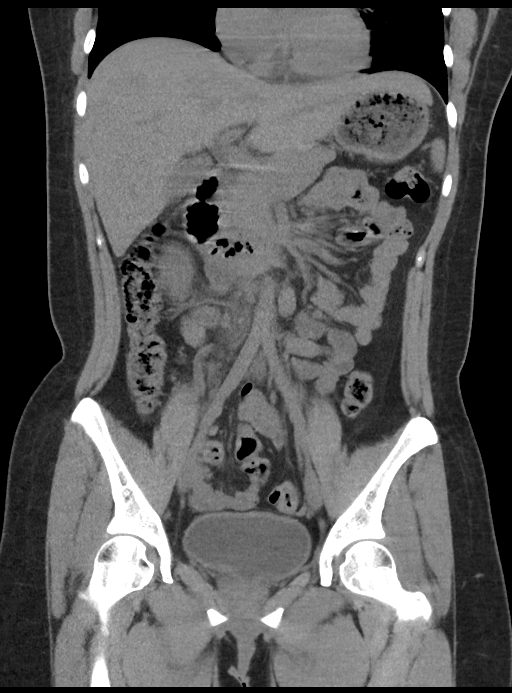

[16 of 46 positions shown; findings below may reference images not displayed]

FINDINGS: Lower chest: No acute abnormality

Hepatobiliary: No focal hepatic abnormality. Gallbladder
unremarkable.

Pancreas: No focal abnormality or ductal dilatation.

Spleen: No focal abnormality.  Normal size.

Adrenals/Urinary Tract: No renal or ureteral stones. No
hydronephrosis. No renal or adrenal mass. Urinary bladder
unremarkable.

Stomach/Bowel: Normal appendix. Stomach, large and small bowel
grossly unremarkable.

Vascular/Lymphatic: No evidence of aneurysm or adenopathy.

Reproductive: Uterus and adnexa unremarkable.  No mass.

Other: No free fluid or free air.

Musculoskeletal: No acute bony abnormality.
IMPRESSION: No renal or ureteral stones.  No hydronephrosis.

## 2023-01-12 DIAGNOSIS — S0993XD Unspecified injury of face, subsequent encounter: Secondary | ICD-10-CM | POA: Diagnosis not present

## 2023-01-14 DIAGNOSIS — Z0183 Encounter for blood typing: Secondary | ICD-10-CM | POA: Diagnosis not present

## 2023-01-14 DIAGNOSIS — Z130 Encounter for screening for diseases of the blood and blood-forming organs and certain disorders involving the immune mechanism: Secondary | ICD-10-CM | POA: Diagnosis not present

## 2023-01-14 DIAGNOSIS — Z3201 Encounter for pregnancy test, result positive: Secondary | ICD-10-CM | POA: Diagnosis not present

## 2023-01-21 DIAGNOSIS — F439 Reaction to severe stress, unspecified: Secondary | ICD-10-CM | POA: Diagnosis not present

## 2023-02-15 DIAGNOSIS — F439 Reaction to severe stress, unspecified: Secondary | ICD-10-CM | POA: Diagnosis not present

## 2023-09-28 DIAGNOSIS — N911 Secondary amenorrhea: Secondary | ICD-10-CM | POA: Diagnosis not present

## 2023-10-12 DIAGNOSIS — N898 Other specified noninflammatory disorders of vagina: Secondary | ICD-10-CM | POA: Diagnosis not present

## 2023-10-12 DIAGNOSIS — Z202 Contact with and (suspected) exposure to infections with a predominantly sexual mode of transmission: Secondary | ICD-10-CM | POA: Diagnosis not present

## 2023-11-30 ENCOUNTER — Ambulatory Visit
Admission: RE | Admit: 2023-11-30 | Discharge: 2023-11-30 | Disposition: A | Discharge status: Home / Self Care | Source: Ambulatory Visit

## 2023-11-30 VITALS — BP 113/77 | HR 86 | Temp 98.6°F | Resp 18

## 2023-11-30 DIAGNOSIS — N3001 Acute cystitis with hematuria: Secondary | ICD-10-CM | POA: Diagnosis not present

## 2023-11-30 DIAGNOSIS — A749 Chlamydial infection, unspecified: Secondary | ICD-10-CM | POA: Insufficient documentation

## 2023-11-30 MED ORDER — SULFAMETHOXAZOLE-TRIMETHOPRIM 800-160 MG PO TABS: 1.0000 | ORAL_TABLET | Freq: Two times a day (BID) | ORAL | 0 refills | Status: DC

## 2023-11-30 NOTE — Discharge Instructions (Signed)
 Common causes of urinary tract infections include but are not limited to holding your urine longer than you should, squatting instead of sitting down when urinating, sitting around in wet clothing such as a wet swimsuit or gym clothes too long, not emptying your bladder after having sexual intercourse, wiping from back to front instead of front to back after having a bowel movement.     The urinalysis that we performed in the clinic today was abnormal.     You were advised to begin antibiotics today because your urinalysis is abnormal and you are having active symptoms of an acute lower urinary tract infection also known as cystitis.     Please pick up and begin taking your prescription for Bactrim  DS (trimethoprim  sulfamethoxazole ) as soon as possible.  Please take all doses exactly as prescribed.  You can take this medication with or without food.  This medication is safe to take with your other medications.   If you have not had complete resolution of your symptoms after completing treatment as prescribed, please return to urgent care for repeat evaluation or follow-up with your primary care provider.  Repeat urinalysis and urine culture may be indicated for more directed therapy.   Thank you for visiting [facility] today.  We appreciate the opportunity to participate in your care.

## 2023-11-30 NOTE — ED Triage Notes (Signed)
 Patient c/o urinary urgency, urinary frequency, and dysuria.   Duration: 1 day   Interventions: Azo
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
## 2023-12-01 ENCOUNTER — Telehealth: Payer: Self-pay | Admitting: Internal Medicine

## 2023-12-01 MED ORDER — SULFAMETHOXAZOLE-TRIMETHOPRIM 800-160 MG PO TABS: 1.0000 | ORAL_TABLET | Freq: Two times a day (BID) | ORAL | 0 refills | Status: AC

## 2023-12-01 NOTE — Telephone Encounter (Signed)
 RX for bactrim resent due to failed pharmacy transmission from 11/30/2023

## 2024-01-24 DIAGNOSIS — F4311 Post-traumatic stress disorder, acute: Secondary | ICD-10-CM | POA: Diagnosis not present

## 2024-01-31 DIAGNOSIS — F4311 Post-traumatic stress disorder, acute: Secondary | ICD-10-CM | POA: Diagnosis not present
